# Patient Record
Sex: Male | Born: 1960 | Race: Black or African American | Hispanic: No | Marital: Married | Smoking: Never smoker
Health system: Southern US, Community
[De-identification: ages and names within clinical notes are randomized; demographics above are authoritative.]

## PROBLEM LIST (undated history)

## (undated) DIAGNOSIS — M1990 Unspecified osteoarthritis, unspecified site: Secondary | ICD-10-CM

## (undated) HISTORY — PX: HERNIA REPAIR: SHX51

## (undated) HISTORY — PX: HYDROCELE EXCISION / REPAIR: SUR1145

---

## 2008-12-01 ENCOUNTER — Ambulatory Visit: Payer: Self-pay | Admitting: Family Medicine

## 2015-03-24 ENCOUNTER — Emergency Department (HOSPITAL_BASED_OUTPATIENT_CLINIC_OR_DEPARTMENT_OTHER)
Admission: EM | Admit: 2015-03-24 | Discharge: 2015-03-25 | Disposition: A | Discharge status: Home / Self Care | Payer: BLUE CROSS/BLUE SHIELD | Attending: Emergency Medicine | Admitting: Emergency Medicine

## 2015-03-24 ENCOUNTER — Encounter (HOSPITAL_BASED_OUTPATIENT_CLINIC_OR_DEPARTMENT_OTHER): Payer: Self-pay | Admitting: *Deleted

## 2015-03-24 DIAGNOSIS — K2970 Gastritis, unspecified, without bleeding: Secondary | ICD-10-CM

## 2015-03-24 DIAGNOSIS — R1013 Epigastric pain: Secondary | ICD-10-CM | POA: Diagnosis present

## 2015-03-24 MED ORDER — ONDANSETRON HCL 4 MG/2ML IJ SOLN
4.0000 mg | Freq: Once | INTRAMUSCULAR | Status: AC
  Administered 2015-03-24: 4 mg via INTRAVENOUS
  Filled 2015-03-24: qty 2

## 2015-03-24 MED ORDER — PANTOPRAZOLE SODIUM 40 MG IV SOLR
40.0000 mg | Freq: Once | INTRAVENOUS | Status: AC
  Administered 2015-03-24: 40 mg via INTRAVENOUS
  Filled 2015-03-24: qty 40

## 2015-03-24 MED ORDER — FENTANYL CITRATE (PF) 100 MCG/2ML IJ SOLN
100.0000 ug | Freq: Once | INTRAMUSCULAR | Status: AC
  Administered 2015-03-24: 100 ug via INTRAVENOUS
  Filled 2015-03-24: qty 2

## 2015-03-24 NOTE — ED Provider Notes (Signed)
CSN: [id number]     Arrival date & time 03/24/15  2202 History  This chart was scribed for Paula Libra, MD by Abel Presto, ED Scribe. This patient was seen in room MH02/MH02 and the patient's care was started at 11:11 PM.    Chief Complaint  Patient presents with  . Abdominal Pain    The history is provided by the patient. No language interpreter was used.    HPI HPI Comments: Richard Peters is a 54 y.o. male who presents to the Emergency Department complaining of 9/10 epigastric abdominal pain with gradual onset around 2 hours ago. Pt describes the pain as a "tightening." He states movement and putting pressure on area aggravates the pain. Pt denies similar pain in past. Pt denies SOB, chest pain, fever, chills, nausea, vomiting, and diarrhea.   History reviewed. No pertinent past medical history. History reviewed. No pertinent past surgical history. History reviewed. No pertinent family history. History  Substance Use Topics  . Smoking status: Never Smoker   . Smokeless tobacco: Not on file  . Alcohol Use: No    Review of Systems A complete 10 system review of systems was obtained and all systems are negative except as noted in the HPI and PMH.     Allergies  Review of patient's allergies indicates no known allergies.  Home Medications   Prior to Admission medications   Not on File   BP 167/91 mmHg  Pulse 65  Temp(Src) 98.1 F (36.7 C)  Resp 16  Ht  (1.854 m)  Wt 220 lb (99.791 kg)  BMI 29.03 kg/m2  SpO2 98%   Physical Exam General: Well-developed, well-nourished male in no acute distress; appearance consistent with age of record HENT: normocephalic; atraumatic Eyes: pupils equal, round and reactive to light; extraocular muscles intact Neck: supple Heart: regular rate and rhythm; no murmurs, rubs or gallops Lungs: clear to auscultation bilaterally Abdomen: epigastric and RUQ tenderness most prominent in epigastric region; soft; nondistended; no masses or  hepatosplenomegaly; bowel sounds present Extremities: No deformity; full range of motion; pulses normal Neurologic: Awake, alert and oriented; motor function intact in all extremities and symmetric; no facial droop Skin: Warm and dry Psychiatric: Normal mood and affect Nursing note and vitals reviewed.   ED Course  Procedures (including critical care time) DIAGNOSTIC STUDIES: Oxygen Saturation is 97% on room air, normal by my interpretation.    COORDINATION OF CARE: 11:14 PM Discussed treatment plan with patient at beside, the patient agrees with the plan and has no further questions at this time.     MDM   Nursing notes and vitals signs, including pulse oximetry, reviewed.  Summary of this visit's results, reviewed by myself:  Labs:  Results for orders placed or performed during the hospital encounter of 03/24/15 (from the past 24 hour(s))  Urinalysis, Routine w reflex microscopic (not at [facility])     Status: Abnormal   Collection Time: 03/24/15 11:00 PM  Result Value Ref Range   Color, Urine YELLOW YELLOW   APPearance CLEAR CLEAR   Specific Gravity, Urine 1.014 1.005 - 1.030   pH 6.5 5.0 - 8.0   Glucose, UA NEGATIVE NEGATIVE mg/dL   Hgb urine dipstick MODERATE (A) NEGATIVE   Bilirubin Urine NEGATIVE NEGATIVE   Ketones, ur NEGATIVE NEGATIVE mg/dL   Protein, ur NEGATIVE NEGATIVE mg/dL   Urobilinogen, UA 0.2 0.0 - 1.0 mg/dL   Nitrite NEGATIVE NEGATIVE   Leukocytes, UA NEGATIVE NEGATIVE  Urine microscopic-add on     Status:  None   Collection Time: 03/24/15 11:00 PM  Result Value Ref Range   Squamous Epithelial / LPF RARE RARE   WBC, UA 0-2 <3 WBC/hpf   RBC / HPF 3-6 <3 RBC/hpf   Bacteria, UA RARE RARE  CBC with Differential/Platelet     Status: None   Collection Time: 03/24/15 11:40 PM  Result Value Ref Range   WBC 6.9 4.0 - 10.5 K/uL   RBC 4.88 4.22 - 5.81 MIL/uL   Hemoglobin 13.8 13.0 - 17.0 g/dL   HCT 16.140.7 09.639.0 - 04.552.0 %   MCV 83.4 78.0 - 100.0 fL   MCH 28.3 26.0 -  34.0 pg   MCHC 33.9 30.0 - 36.0 g/dL   RDW 40.913.9 81.111.5 - 91.415.5 %   Platelets 245 150 - 400 K/uL   Neutrophils Relative % 74 43 - 77 %   Neutro Abs 5.1 1.7 - 7.7 K/uL   Lymphocytes Relative 16 12 - 46 %   Lymphs Abs 1.1 0.7 - 4.0 K/uL   Monocytes Relative 8 3 - 12 %   Monocytes Absolute 0.5 0.1 - 1.0 K/uL   Eosinophils Relative 2 0 - 5 %   Eosinophils Absolute 0.1 0.0 - 0.7 K/uL   Basophils Relative 0 0 - 1 %   Basophils Absolute 0.0 0.0 - 0.1 K/uL  Comprehensive metabolic panel     Status: None   Collection Time: 03/24/15 11:40 PM  Result Value Ref Range   Sodium 141 135 - 145 mmol/L   Potassium 3.7 3.5 - 5.1 mmol/L   Chloride 104 101 - 111 mmol/L   CO2 28 22 - 32 mmol/L   Glucose, Bld 90 65 - 99 mg/dL   BUN 12 6 - 20 mg/dL   Creatinine, Ser 7.821.12 0.61 - 1.24 mg/dL   Calcium 9.7 8.9 - 95.610.3 mg/dL   Total Protein 7.6 6.5 - 8.1 g/dL   Albumin 4.5 3.5 - 5.0 g/dL   AST 29 15 - 41 U/L   ALT 29 17 - 63 U/L   Alkaline Phosphatase 48 38 - 126 U/L   Total Bilirubin 0.8 0.3 - 1.2 mg/dL   GFR calc non Af Amer >60 >60 mL/min   GFR calc Af Amer >60 >60 mL/min   Anion gap 9 5 - 15  Lipase, blood     Status: None   Collection Time: 03/24/15 11:40 PM  Result Value Ref Range   Lipase 28 22 - 51 U/L    Imaging Studies: Ct Abdomen Pelvis W Contrast  03/25/2015   CLINICAL DATA:  Epigastric pain for 6 hours. Right upper quadrant pain.  EXAM: CT ABDOMEN AND PELVIS WITH CONTRAST  TECHNIQUE: Multidetector CT imaging of the abdomen and pelvis was performed using the standard protocol following bolus administration of intravenous contrast.  CONTRAST:  50mL OMNIPAQUE IOHEXOL 300 MG/ML SOLN, 100mL OMNIPAQUE IOHEXOL 300 MG/ML SOLN  COMPARISON:  None.  FINDINGS: Mild linear atelectasis in the lung bases. The liver, spleen, gallbladder, pancreas, adrenal glands, kidneys, abdominal aorta, inferior vena cava, and retroperitoneal lymph nodes are unremarkable. Small accessory spleen. The stomach, small bowel, and  colon are not abnormally distended. No free air or free fluid in the abdomen.  Pelvis: Prostate gland is not enlarged. Bladder wall is not thickened. No free or loculated pelvic fluid collections. No pelvic mass or lymphadenopathy. Appendix is normal. Sigmoid colon is unremarkable. Degenerative changes in the lumbar spine and hips. No destructive bone lesions.  IMPRESSION: No acute process demonstrated in the  abdomen or pelvis.   Electronically Signed   By: Burman Nieves M.D.   On: 03/25/2015 02:24     1:20 AM No relief with GI cocktail.   3:03 AM Significant relief with Carafate.  I personally performed the services described in this documentation, which was scribed in my presence. The recorded information has been reviewed and is accurate.    Paula Libra, MD 03/25/15 [phone]

## 2015-03-24 NOTE — ED Notes (Signed)
Pt c/o upper abd pain x 1 hr denies n/v/d

## 2015-03-25 ENCOUNTER — Emergency Department (HOSPITAL_BASED_OUTPATIENT_CLINIC_OR_DEPARTMENT_OTHER): Payer: BLUE CROSS/BLUE SHIELD

## 2015-03-25 LAB — COMPREHENSIVE METABOLIC PANEL
ALK PHOS: 48 U/L (ref 38–126)
ALT: 29 U/L (ref 17–63)
ANION GAP: 9 (ref 5–15)
AST: 29 U/L (ref 15–41)
Albumin: 4.5 g/dL (ref 3.5–5.0)
BUN: 12 mg/dL (ref 6–20)
CHLORIDE: 104 mmol/L (ref 101–111)
CO2: 28 mmol/L (ref 22–32)
CREATININE: 1.12 mg/dL (ref 0.61–1.24)
Calcium: 9.7 mg/dL (ref 8.9–10.3)
GFR calc Af Amer: >60 mL/min (ref >60)
GLUCOSE: 90 mg/dL (ref 65–99)
Potassium: 3.7 mmol/L (ref 3.5–5.1)
Sodium: 141 mmol/L (ref 135–145)
Total Bilirubin: 0.8 mg/dL (ref 0.3–1.2)
Total Protein: 7.6 g/dL (ref 6.5–8.1)

## 2015-03-25 LAB — CBC WITH DIFFERENTIAL/PLATELET
Basophils Absolute: 0 10*3/uL (ref 0.0–0.1)
Basophils Relative: 0 % (ref 0–1)
EOS ABS: 0.1 10*3/uL (ref 0.0–0.7)
EOS PCT: 2 % (ref 0–5)
HCT: 40.7 % (ref 39.0–52.0)
Hemoglobin: 13.8 g/dL (ref 13.0–17.0)
LYMPHS ABS: 1.1 10*3/uL (ref 0.7–4.0)
Lymphocytes Relative: 16 % (ref 12–46)
MCH: 28.3 pg (ref 26.0–34.0)
MCHC: 33.9 g/dL (ref 30.0–36.0)
MCV: 83.4 fL (ref 78.0–100.0)
Monocytes Absolute: 0.5 10*3/uL (ref 0.1–1.0)
Monocytes Relative: 8 % (ref 3–12)
NEUTROS ABS: 5.1 10*3/uL (ref 1.7–7.7)
NEUTROS PCT: 74 % (ref 43–77)
Platelets: 245 10*3/uL (ref 150–400)
RBC: 4.88 MIL/uL (ref 4.22–5.81)
RDW: 13.9 % (ref 11.5–15.5)
WBC: 6.9 10*3/uL (ref 4.0–10.5)

## 2015-03-25 LAB — LIPASE, BLOOD: Lipase: 28 U/L (ref 22–51)

## 2015-03-25 LAB — URINE MICROSCOPIC-ADD ON

## 2015-03-25 LAB — URINALYSIS, ROUTINE W REFLEX MICROSCOPIC
Bilirubin Urine: NEGATIVE
Glucose, UA: NEGATIVE mg/dL
Ketones, ur: NEGATIVE mg/dL
LEUKOCYTES UA: NEGATIVE
NITRITE: NEGATIVE
PROTEIN: NEGATIVE mg/dL
Specific Gravity, Urine: 1.014 (ref 1.005–1.030)
Urobilinogen, UA: 0.2 mg/dL (ref 0.0–1.0)
pH: 6.5 (ref 5.0–8.0)

## 2015-03-25 MED ORDER — IOHEXOL 300 MG/ML  SOLN
50.0000 mL | Freq: Once | INTRAMUSCULAR | Status: AC | PRN
  Administered 2015-03-25: 50 mL via ORAL

## 2015-03-25 MED ORDER — SUCRALFATE 1 G PO TABS
1.0000 g | ORAL_TABLET | Freq: Once | ORAL | Status: AC
  Administered 2015-03-25: 1 g via ORAL

## 2015-03-25 MED ORDER — FENTANYL CITRATE (PF) 100 MCG/2ML IJ SOLN
100.0000 ug | Freq: Once | INTRAMUSCULAR | Status: AC
  Administered 2015-03-25: 100 ug via INTRAVENOUS
  Filled 2015-03-25: qty 2

## 2015-03-25 MED ORDER — IOHEXOL 300 MG/ML  SOLN
100.0000 mL | Freq: Once | INTRAMUSCULAR | Status: AC | PRN
  Administered 2015-03-25: 100 mL via INTRAVENOUS

## 2015-03-25 MED ORDER — GI COCKTAIL ~~LOC~~
30.0000 mL | Freq: Once | ORAL | Status: AC
  Administered 2015-03-25: 30 mL via ORAL
  Filled 2015-03-25: qty 30

## 2015-03-25 MED ORDER — OMEPRAZOLE 40 MG PO CPDR: DELAYED_RELEASE_CAPSULE | ORAL | Status: DC

## 2015-03-25 MED ORDER — SUCRALFATE 1 G PO TABS: 1.0000 g | ORAL_TABLET | Freq: Three times a day (TID) | ORAL | Status: DC

## 2015-03-25 MED ORDER — SUCRALFATE 1 G PO TABS
ORAL_TABLET | ORAL | Status: AC
  Filled 2015-03-25: qty 1

## 2015-03-25 NOTE — ED Notes (Signed)
Pt given oral contrast by Ct staff.

## 2015-03-25 NOTE — Discharge Instructions (Signed)

## 2015-03-25 NOTE — ED Notes (Signed)
Patient transported to CT 

## 2015-03-25 NOTE — ED Notes (Signed)
MD at bedside. 

## 2016-11-05 ENCOUNTER — Emergency Department (HOSPITAL_BASED_OUTPATIENT_CLINIC_OR_DEPARTMENT_OTHER)
Admission: EM | Admit: 2016-11-05 | Discharge: 2016-11-05 | Disposition: A | Discharge status: Home / Self Care | Payer: BLUE CROSS/BLUE SHIELD | Attending: Emergency Medicine | Admitting: Emergency Medicine

## 2016-11-05 ENCOUNTER — Encounter (HOSPITAL_BASED_OUTPATIENT_CLINIC_OR_DEPARTMENT_OTHER): Payer: Self-pay

## 2016-11-05 DIAGNOSIS — R1010 Upper abdominal pain, unspecified: Secondary | ICD-10-CM | POA: Diagnosis present

## 2016-11-05 DIAGNOSIS — R1013 Epigastric pain: Secondary | ICD-10-CM | POA: Insufficient documentation

## 2016-11-05 LAB — CBC WITH DIFFERENTIAL/PLATELET
Basophils Absolute: 0 10*3/uL (ref 0.0–0.1)
Basophils Relative: 1 %
EOS ABS: 0.1 10*3/uL (ref 0.0–0.7)
EOS PCT: 3 %
HCT: 37.2 % — ABNORMAL LOW (ref 39.0–52.0)
Hemoglobin: 12.7 g/dL — ABNORMAL LOW (ref 13.0–17.0)
Lymphocytes Relative: 23 %
Lymphs Abs: 1.3 10*3/uL (ref 0.7–4.0)
MCH: 28.2 pg (ref 26.0–34.0)
MCHC: 34.1 g/dL (ref 30.0–36.0)
MCV: 82.7 fL (ref 78.0–100.0)
Monocytes Absolute: 0.5 10*3/uL (ref 0.1–1.0)
Monocytes Relative: 8 %
NEUTROS PCT: 65 %
Neutro Abs: 3.6 10*3/uL (ref 1.7–7.7)
PLATELETS: 239 10*3/uL (ref 150–400)
RBC: 4.5 MIL/uL (ref 4.22–5.81)
RDW: 14 % (ref 11.5–15.5)
WBC: 5.5 10*3/uL (ref 4.0–10.5)

## 2016-11-05 LAB — COMPREHENSIVE METABOLIC PANEL
ALT: 20 U/L (ref 17–63)
AST: 23 U/L (ref 15–41)
Albumin: 4 g/dL (ref 3.5–5.0)
Alkaline Phosphatase: 43 U/L (ref 38–126)
Anion gap: 7 (ref 5–15)
BILIRUBIN TOTAL: 0.6 mg/dL (ref 0.3–1.2)
BUN: 16 mg/dL (ref 6–20)
CHLORIDE: 106 mmol/L (ref 101–111)
CO2: 26 mmol/L (ref 22–32)
CREATININE: 1.15 mg/dL (ref 0.61–1.24)
Calcium: 9.3 mg/dL (ref 8.9–10.3)
GFR calc non Af Amer: >60 mL/min (ref >60)
Glucose, Bld: 142 mg/dL — ABNORMAL HIGH (ref 65–99)
POTASSIUM: 3.5 mmol/L (ref 3.5–5.1)
Sodium: 139 mmol/L (ref 135–145)
TOTAL PROTEIN: 7.5 g/dL (ref 6.5–8.1)

## 2016-11-05 LAB — LIPASE, BLOOD: LIPASE: 28 U/L (ref 11–51)

## 2016-11-05 MED ORDER — OMEPRAZOLE 40 MG PO CPDR: DELAYED_RELEASE_CAPSULE | ORAL | 0 refills | Status: DC

## 2016-11-05 MED ORDER — SUCRALFATE 1 G PO TABS
1.0000 g | ORAL_TABLET | Freq: Once | ORAL | Status: AC
  Administered 2016-11-05: 1 g via ORAL
  Filled 2016-11-05: qty 1

## 2016-11-05 MED ORDER — ONDANSETRON HCL 4 MG/2ML IJ SOLN
4.0000 mg | Freq: Once | INTRAMUSCULAR | Status: AC
  Administered 2016-11-05: 4 mg via INTRAVENOUS
  Filled 2016-11-05: qty 2

## 2016-11-05 MED ORDER — PANTOPRAZOLE SODIUM 40 MG IV SOLR
INTRAVENOUS | Status: AC
  Filled 2016-11-05: qty 40

## 2016-11-05 MED ORDER — PANTOPRAZOLE SODIUM 40 MG IV SOLR
40.0000 mg | Freq: Once | INTRAVENOUS | Status: AC
  Administered 2016-11-05: 40 mg via INTRAVENOUS

## 2016-11-05 MED ORDER — FENTANYL CITRATE (PF) 100 MCG/2ML IJ SOLN
100.0000 ug | Freq: Once | INTRAMUSCULAR | Status: AC
  Administered 2016-11-05: 100 ug via INTRAVENOUS
  Filled 2016-11-05: qty 2

## 2016-11-05 MED ORDER — SUCRALFATE 1 G PO TABS: 1.0000 g | ORAL_TABLET | Freq: Three times a day (TID) | ORAL | 0 refills | Status: DC

## 2016-11-05 NOTE — ED Triage Notes (Signed)
Pt c/o upper abdominal pain since 11p, states took Malanta with no relief

## 2016-11-05 NOTE — ED Provider Notes (Signed)
[facility] Provider Note: Lowella DellJ. Lane Shenouda Genova, MD, FACEP  CSN: [id number] MRN: [id number] ARRIVAL: 11/05/16 at 0308 ROOM: MH02/MH02   CHIEF COMPLAINT  Abdominal Pain   HISTORY OF PRESENT ILLNESS  Richard Peters is a 56 y.o. male with upper abdominal pain since about 1 AM today. Scribed the pain is dull. He rates it as a 9 out of 10. It is somewhat worse with movement or palpation. He states the pain is similar to that of gastritis 2 years ago. There is no associated nausea, vomiting or diarrhea. He took Mylanta with no relief.   History reviewed. No pertinent past medical history.  History reviewed. No pertinent surgical history.  No family history on file.  Social History  Substance Use Topics  . Smoking status: Never Smoker  . Smokeless tobacco: Never Used  . Alcohol use No    Prior to Admission medications   Medication Sig Start Date End Date Taking? Authorizing Provider  omeprazole (PRILOSEC) 40 MG capsule Take 1 capsule daily at least 30 minutes before first dose of Carafate. 11/05/16   Dejanira Pamintuan, MD  sucralfate (CARAFATE) 1 g tablet Take 1 tablet (1 g total) by mouth 4 (four) times daily -  with meals and at bedtime. 11/05/16   Paula LibraJohn Tenee Wish, MD    Allergies Patient has no known allergies.   REVIEW OF SYSTEMS  Negative except as noted here or in the History of Present Illness.   PHYSICAL EXAMINATION  Initial Vital Signs Blood pressure 156/92, pulse (!) 53, temperature 98.1 F (36.7 C), temperature source Oral, resp. rate 16, height 6\' 1"  (1.854 m), weight 230 lb (104.3 kg), SpO2 97 %.  Examination General: Well-developed, well-nourished male in no acute distress; appearance consistent with age of record HENT: normocephalic; atraumatic Eyes: pupils equal, round and reactive to light; extraocular muscles intact Neck: supple Heart: regular rate and rhythm Lungs: clear to auscultation bilaterally Abdomen: soft; nondistended; tenderness across the entire  upper abdomen; no masses or hepatosplenomegaly; bowel sounds present Extremities: No deformity; full range of motion; pulses normal Neurologic: Awake, alert and oriented; motor function intact in all extremities and symmetric; no facial droop Skin: Warm and dry Psychiatric: Normal mood and affect   RESULTS  Summary of this visit's results, reviewed by myself:   EKG Interpretation  Date/Time:    Ventricular Rate:    PR Interval:    QRS Duration:   QT Interval:    QTC Calculation:   R Axis:     Text Interpretation:        Laboratory Studies: Results for orders placed or performed during the hospital encounter of 11/05/16 (from the past 24 hour(s))  Lipase, blood     Status: None   Collection Time: 11/05/16  3:57 AM  Result Value Ref Range   Lipase 28 11 - 51 U/L  Comprehensive metabolic panel     Status: Abnormal   Collection Time: 11/05/16  3:57 AM  Result Value Ref Range   Sodium 139 135 - 145 mmol/L   Potassium 3.5 3.5 - 5.1 mmol/L   Chloride 106 101 - 111 mmol/L   CO2 26 22 - 32 mmol/L   Glucose, Bld 142 (H) 65 - 99 mg/dL   BUN 16 6 - 20 mg/dL   Creatinine, Ser 5.951.15 0.61 - 1.24 mg/dL   Calcium 9.3 8.9 - 63.810.3 mg/dL   Total Protein 7.5 6.5 - 8.1 g/dL   Albumin 4.0 3.5 - 5.0 g/dL   AST 23 15 -  41 U/L   ALT 20 17 - 63 U/L   Alkaline Phosphatase 43 38 - 126 U/L   Total Bilirubin 0.6 0.3 - 1.2 mg/dL   GFR calc non Af Amer >60 >60 mL/min   GFR calc Af Amer >60 >60 mL/min   Anion gap 7 5 - 15  CBC with Differential/Platelet     Status: Abnormal   Collection Time: 11/05/16  3:57 AM  Result Value Ref Range   WBC 5.5 4.0 - 10.5 K/uL   RBC 4.50 4.22 - 5.81 MIL/uL   Hemoglobin 12.7 (L) 13.0 - 17.0 g/dL   HCT 95.2 (L) 84.1 - 32.4 %   MCV 82.7 78.0 - 100.0 fL   MCH 28.2 26.0 - 34.0 pg   MCHC 34.1 30.0 - 36.0 g/dL   RDW 40.1 02.7 - 25.3 %   Platelets 239 150 - 400 K/uL   Neutrophils Relative % 65 %   Neutro Abs 3.6 1.7 - 7.7 K/uL   Lymphocytes Relative 23 %   Lymphs  Abs 1.3 0.7 - 4.0 K/uL   Monocytes Relative 8 %   Monocytes Absolute 0.5 0.1 - 1.0 K/uL   Eosinophils Relative 3 %   Eosinophils Absolute 0.1 0.0 - 0.7 K/uL   Basophils Relative 1 %   Basophils Absolute 0.0 0.0 - 0.1 K/uL   Imaging Studies: No results found.  ED COURSE  Nursing notes and initial vitals signs, including pulse oximetry, reviewed.  Vitals:   11/05/16 0329 11/05/16 0330  BP: 156/92   Pulse: (!) 53   Resp: 16   Temp: 98.1 F (36.7 C)   TempSrc: Oral   SpO2: 97%   Weight:  230 lb (104.3 kg)  Height:  6\' 1"  (1.854 m)   4:49 AM No relief with Carafate orally and Protonix IV.  5:49 AM Feels better after IV fentanyl. Minimal epigastric tenderness remains. Patient advised of reassuring lab studies. I don't believe an imaging study is indicated at this time. Symptoms are consistent with gastritis. He has not been on regular treatment for gastritis. Will start him on omeprazole and Carafate and advised him to return if symptoms worsen or change.  PROCEDURES    ED DIAGNOSES     ICD-9-CM ICD-10-CM   1. Epigastric abdominal pain 789.06 R10.13        Paula Libra, MD 11/05/16 [phone]

## 2019-09-20 DIAGNOSIS — Z20828 Contact with and (suspected) exposure to other viral communicable diseases: Secondary | ICD-10-CM | POA: Diagnosis not present

## 2019-09-27 ENCOUNTER — Ambulatory Visit: Payer: Self-pay | Attending: Internal Medicine

## 2019-09-27 DIAGNOSIS — Z20822 Contact with and (suspected) exposure to covid-19: Secondary | ICD-10-CM

## 2019-09-28 LAB — NOVEL CORONAVIRUS, NAA: SARS-CoV-2, NAA: NOT DETECTED

## 2019-11-16 DIAGNOSIS — M25461 Effusion, right knee: Secondary | ICD-10-CM | POA: Diagnosis not present

## 2019-11-16 DIAGNOSIS — M25569 Pain in unspecified knee: Secondary | ICD-10-CM | POA: Diagnosis not present

## 2020-03-13 DIAGNOSIS — M2242 Chondromalacia patellae, left knee: Secondary | ICD-10-CM | POA: Diagnosis not present

## 2020-03-13 DIAGNOSIS — M2241 Chondromalacia patellae, right knee: Secondary | ICD-10-CM | POA: Diagnosis not present

## 2020-04-09 DIAGNOSIS — M25562 Pain in left knee: Secondary | ICD-10-CM | POA: Diagnosis not present

## 2020-04-09 DIAGNOSIS — M25561 Pain in right knee: Secondary | ICD-10-CM | POA: Diagnosis not present

## 2020-08-23 DIAGNOSIS — Z20822 Contact with and (suspected) exposure to covid-19: Secondary | ICD-10-CM | POA: Diagnosis not present

## 2020-10-04 DIAGNOSIS — U071 COVID-19: Secondary | ICD-10-CM | POA: Diagnosis not present

## 2020-10-12 DIAGNOSIS — U071 COVID-19: Secondary | ICD-10-CM | POA: Diagnosis not present

## 2020-12-09 ENCOUNTER — Emergency Department (HOSPITAL_BASED_OUTPATIENT_CLINIC_OR_DEPARTMENT_OTHER): Payer: BC Managed Care – PPO

## 2020-12-09 ENCOUNTER — Other Ambulatory Visit: Payer: Self-pay

## 2020-12-09 ENCOUNTER — Emergency Department (HOSPITAL_BASED_OUTPATIENT_CLINIC_OR_DEPARTMENT_OTHER)
Admission: EM | Admit: 2020-12-09 | Discharge: 2020-12-09 | Disposition: A | Discharge status: Home / Self Care | Payer: BC Managed Care – PPO | Attending: Emergency Medicine | Admitting: Emergency Medicine

## 2020-12-09 ENCOUNTER — Encounter (HOSPITAL_BASED_OUTPATIENT_CLINIC_OR_DEPARTMENT_OTHER): Payer: Self-pay

## 2020-12-09 DIAGNOSIS — N432 Other hydrocele: Secondary | ICD-10-CM | POA: Diagnosis not present

## 2020-12-09 DIAGNOSIS — N503 Cyst of epididymis: Secondary | ICD-10-CM | POA: Diagnosis not present

## 2020-12-09 DIAGNOSIS — N5089 Other specified disorders of the male genital organs: Secondary | ICD-10-CM | POA: Diagnosis not present

## 2020-12-09 DIAGNOSIS — N433 Hydrocele, unspecified: Secondary | ICD-10-CM | POA: Diagnosis not present

## 2020-12-09 DIAGNOSIS — N50811 Right testicular pain: Secondary | ICD-10-CM | POA: Diagnosis not present

## 2020-12-09 NOTE — ED Triage Notes (Signed)
Pt presents with complaints of R testicle pain x 1 week.

## 2020-12-09 NOTE — ED Provider Notes (Signed)
[facility] EMERGENCY DEPARTMENT Provider Note   CSN: [id number] Arrival date & time: 12/09/20  1949     History Chief Complaint  Patient presents with  . Testicle Pain    Richard Peters is a 60 y.o. male.  The history is provided by the patient.  Testicle Pain This is a new problem. Episode onset: 1 week. The problem occurs constantly. The problem has been gradually worsening. Pertinent negatives include no chest pain, no abdominal pain, no headaches and no shortness of breath. Nothing aggravates the symptoms. Nothing relieves the symptoms. He has tried nothing for the symptoms. The treatment provided no relief.       History reviewed. No pertinent past medical history.  There are no problems to display for this patient.   History reviewed. No pertinent surgical history.     No family history on file.  Social History   Tobacco Use  . Smoking status: Never Smoker  . Smokeless tobacco: Never Used  Substance Use Topics  . Alcohol use: No  . Drug use: No    Home Medications Prior to Admission medications   Medication Sig Start Date End Date Taking? Authorizing Provider  omeprazole (PRILOSEC) 40 MG capsule Take 1 capsule daily at least 30 minutes before first dose of Carafate. 11/05/16   Molpus, John, MD  sucralfate (CARAFATE) 1 g tablet Take 1 tablet (1 g total) by mouth 4 (four) times daily -  with meals and at bedtime. 11/05/16   Molpus, Jonny Ruiz, MD    Allergies    Patient has no known allergies.  Review of Systems   Review of Systems  Constitutional: Negative for chills and fever.  HENT: Negative for ear pain and sore throat.   Eyes: Negative for pain and visual disturbance.  Respiratory: Negative for cough and shortness of breath.   Cardiovascular: Negative for chest pain and palpitations.  Gastrointestinal: Negative for abdominal pain and vomiting.  Genitourinary: Positive for scrotal swelling and testicular pain. Negative for difficulty urinating,  dysuria, genital sores, hematuria, penile discharge and penile pain.  Musculoskeletal: Negative for arthralgias and back pain.  Skin: Negative for color change and rash.  Neurological: Negative for seizures, syncope and headaches.  All other systems reviewed and are negative.   Physical Exam Updated Vital Signs BP (!) 193/95 (BP Location: Right Arm)   Pulse 75   Temp 98.3 F (36.8 C)   Resp 18   Ht 6' (1.829 m)   Wt 102.1 kg   SpO2 98%   BMI 30.52 kg/m   Physical Exam Vitals and nursing note reviewed. Exam conducted with a chaperone present.  Constitutional:      Appearance: Normal appearance.  HENT:     Head: Normocephalic and atraumatic.  Eyes:     Conjunctiva/sclera: Conjunctivae normal.  Pulmonary:     Effort: Pulmonary effort is normal. No respiratory distress.  Genitourinary:    Penis: Normal.      Comments: Large, diffusely tender, swollen right hemiscrotum without reducible hernia. Musculoskeletal:        General: No deformity. Normal range of motion.     Cervical back: Normal range of motion.  Skin:    General: Skin is warm and dry.  Neurological:     General: No focal deficit present.     Mental Status: He is alert and oriented to person, place, and time. Mental status is at baseline.  Psychiatric:        Mood and Affect: Mood normal.  ED Results / Procedures / Treatments   Labs (all labs ordered are listed, but only abnormal results are displayed) Labs Reviewed - No data to display  EKG None  Radiology US SCROTUM W/DOPPLER  Result Date: 12/09/2020 CLINICAL DATA:  Right-sided testicular swelling for 1 week EXAM: SCROTAL ULTRASOUND DOPPLER ULTRASOUND OF THE TESTICLES TECHNIQUE: Complete ultrasound examination of the testicles, epididymis, and other scrotal structures was performed. Color and spectral Doppler ultrasound were also utilized to evaluate blood flow to the testicles. COMPARISON:  None. FINDINGS: Right testicle Measurements: 4.7 x 3.3 x  3.5 cm. Scattered microcalcifications are noted. No mass lesion is seen. Left testicle Measurements: 3.2 x 3.2 x 3.2 cm. Scattered microcalcifications are noted. No mass lesion is noted. Right epididymis:  3 mm cyst is noted in the head of the epididymis. Left epididymis:  Normal in size and appearance. Hydrocele: Bilateral hydroceles are noted right considerably larger than left. Varicocele:  None visualized. Pulsed Doppler interrogation of both testes demonstrates normal low resistance arterial and venous waveforms bilaterally. IMPRESSION: Bilateral hydroceles right considerably greater than left. This would account for the testicular swelling. Microcalcifications in the testicles bilaterally although no discrete mass is seen. Small right epididymal cyst. Electronically Signed   By: Alcide Clever M.D.   On: 12/09/2020 21:13    Procedures Procedures   Medications Ordered in ED Medications - No data to display  ED Course  I have reviewed the triage vital signs and the nursing notes.  Pertinent labs & imaging results that were available during my care of the patient were reviewed by me and considered in my medical decision making (see chart for details).    MDM Rules/Calculators/A&P                          Ofilia Neas presents with atraumatic right testicle swelling.  No other symptoms.  Specifically, no symptoms of a sexually transmitted infection, UTI, or genital trauma.  Exam not consistent with a hernia.  Ultrasound reveals a hydrocele.  The patient will be referred to urology for outpatient management. Final Clinical Impression(s) / ED Diagnoses Final diagnoses:  Other hydrocele    Rx / DC Orders ED Discharge Orders    None       Koleen Distance, MD 12/09/20 2136

## 2020-12-09 NOTE — ED Notes (Signed)
Patient transported to Ultrasound 

## 2021-01-11 DIAGNOSIS — N430 Encysted hydrocele: Secondary | ICD-10-CM | POA: Diagnosis not present

## 2021-02-10 DIAGNOSIS — N430 Encysted hydrocele: Secondary | ICD-10-CM | POA: Diagnosis not present

## 2021-02-22 DIAGNOSIS — R8271 Bacteriuria: Secondary | ICD-10-CM | POA: Diagnosis not present

## 2021-02-22 DIAGNOSIS — N430 Encysted hydrocele: Secondary | ICD-10-CM | POA: Diagnosis not present

## 2021-03-02 DIAGNOSIS — N430 Encysted hydrocele: Secondary | ICD-10-CM | POA: Diagnosis not present

## 2021-04-28 ENCOUNTER — Ambulatory Visit (INDEPENDENT_AMBULATORY_CARE_PROVIDER_SITE_OTHER): Payer: BC Managed Care – PPO | Admitting: Family Medicine

## 2021-04-28 ENCOUNTER — Other Ambulatory Visit: Payer: Self-pay

## 2021-04-28 ENCOUNTER — Encounter: Payer: Self-pay | Admitting: Family Medicine

## 2021-04-28 VITALS — BP 128/82 | HR 59 | Temp 98.2°F | Ht 71.5 in | Wt 218.6 lb

## 2021-04-28 DIAGNOSIS — Z23 Encounter for immunization: Secondary | ICD-10-CM

## 2021-04-28 DIAGNOSIS — Z0000 Encounter for general adult medical examination without abnormal findings: Secondary | ICD-10-CM

## 2021-04-28 DIAGNOSIS — R351 Nocturia: Secondary | ICD-10-CM

## 2021-04-28 DIAGNOSIS — Z1159 Encounter for screening for other viral diseases: Secondary | ICD-10-CM

## 2021-04-28 DIAGNOSIS — Z9889 Other specified postprocedural states: Secondary | ICD-10-CM

## 2021-04-28 DIAGNOSIS — Z125 Encounter for screening for malignant neoplasm of prostate: Secondary | ICD-10-CM

## 2021-04-28 DIAGNOSIS — M25561 Pain in right knee: Secondary | ICD-10-CM

## 2021-04-28 NOTE — Progress Notes (Signed)
   Subjective:    Patient ID: Richard Peters, male    DOB: 31-Mar-1961, 60 y.o.   MRN: [id number]  HPI He is here for complete examination.  He has been getting his care episodically only on an as-needed basis.  He is here at his wife's request.  He has had a recent hydrocelectomy and is still having difficulty and does have an appointment set up with urology in several weeks.  He does complain of some nocturia but no hesitancy or decreased stream.  He did have a colonoscopy roughly 6 years ago.  Does complain of some knee pain and apparently was seen and told he had decreased cartilage.  Presently he is not under any care and not taking any medication for this.  Family and social history as well as health maintenance and immunizations was reviewed.   Review of Systems  All other systems reviewed and are negative.     Objective:   Physical Exam Alert and in no distress. Tympanic membranes and canals are normal. Pharyngeal area is normal. Neck is supple without adenopathy or thyromegaly. Cardiac exam shows a regular sinus rhythm without murmurs or gallops. Lungs are clear to auscultation.Abdominal exam shows no masses or tenderness.  Genitalia shows no evidence of hydrocele.  Uncircumcised penis.  Testes seem to normal.  Rectal exam shows a normal-sized prostate.        Assessment & Plan:  Routine general medical examination at a health care facility - Plan: CBC with Differential/Platelet, Comprehensive metabolic panel, Lipid panel  Nocturia  Need for hepatitis C screening test - Plan: Hepatitis C antibody  Need for Tdap vaccination - Plan: Tdap vaccine greater than or equal to 7yo IM  History of hydrocelectomy  Screening for prostate cancer - Plan: PSA  Right knee pain, unspecified chronicity  I discussed the nocturia with him and recommend that he cut back on fluids at night and make sure he empties his bladder.  Explained that I did not think it was due to prostate  enlargement Recommend he follow-up with urology concerning continued pain.With urology concerning the continued pain in the genital area. I then discussed the knee pain with himRecommending Tylenol for initial pain relief and then using NSAID and if continued difficulty coming here for further care. I recommended that he get Shingrix at age 46

## 2021-04-29 LAB — COMPREHENSIVE METABOLIC PANEL
ALT: 20 IU/L (ref 0–44)
AST: 22 IU/L (ref 0–40)
Albumin/Globulin Ratio: 1.8 (ref 1.2–2.2)
Albumin: 4.5 g/dL (ref 3.8–4.9)
Alkaline Phosphatase: 57 IU/L (ref 44–121)
BUN/Creatinine Ratio: 13 (ref 9–20)
BUN: 14 mg/dL (ref 6–24)
Bilirubin Total: 0.8 mg/dL (ref 0.0–1.2)
CO2: 24 mmol/L (ref 20–29)
Calcium: 9.4 mg/dL (ref 8.7–10.2)
Chloride: 104 mmol/L (ref 96–106)
Creatinine, Ser: 1.04 mg/dL (ref 0.76–1.27)
Globulin, Total: 2.5 g/dL (ref 1.5–4.5)
Glucose: 73 mg/dL (ref 65–99)
Potassium: 4.2 mmol/L (ref 3.5–5.2)
Sodium: 143 mmol/L (ref 134–144)
Total Protein: 7 g/dL (ref 6.0–8.5)
eGFR: 83 mL/min/{1.73_m2} (ref >59)

## 2021-04-29 LAB — CBC WITH DIFFERENTIAL/PLATELET
Basophils Absolute: 0 10*3/uL (ref 0.0–0.2)
Basos: 1 %
EOS (ABSOLUTE): 0.1 10*3/uL (ref 0.0–0.4)
Eos: 2 %
Hematocrit: 37.8 % (ref 37.5–51.0)
Hemoglobin: 12.5 g/dL — ABNORMAL LOW (ref 13.0–17.7)
Immature Grans (Abs): 0 10*3/uL (ref 0.0–0.1)
Immature Granulocytes: 0 %
Lymphocytes Absolute: 1.6 10*3/uL (ref 0.7–3.1)
Lymphs: 36 %
MCH: 27.4 pg (ref 26.6–33.0)
MCHC: 33.1 g/dL (ref 31.5–35.7)
MCV: 83 fL (ref 79–97)
Monocytes Absolute: 0.5 10*3/uL (ref 0.1–0.9)
Monocytes: 10 %
Neutrophils Absolute: 2.3 10*3/uL (ref 1.4–7.0)
Neutrophils: 51 %
Platelets: 222 10*3/uL (ref 150–450)
RBC: 4.56 x10E6/uL (ref 4.14–5.80)
RDW: 13.8 % (ref 11.6–15.4)
WBC: 4.5 10*3/uL (ref 3.4–10.8)

## 2021-04-29 LAB — LIPID PANEL
Chol/HDL Ratio: 3.2 ratio (ref 0.0–5.0)
Cholesterol, Total: 126 mg/dL (ref 100–199)
HDL: 40 mg/dL (ref >39)
LDL Chol Calc (NIH): 73 mg/dL (ref 0–99)
Triglycerides: 63 mg/dL (ref 0–149)
VLDL Cholesterol Cal: 13 mg/dL (ref 5–40)

## 2021-04-29 LAB — PSA: Prostate Specific Ag, Serum: 0.6 ng/mL (ref 0.0–4.0)

## 2021-04-29 LAB — HEPATITIS C ANTIBODY: Hep C Virus Ab: <0.1 s/co ratio (ref 0.0–0.9)

## 2021-05-24 DIAGNOSIS — N430 Encysted hydrocele: Secondary | ICD-10-CM | POA: Diagnosis not present

## 2021-05-24 DIAGNOSIS — N5082 Scrotal pain: Secondary | ICD-10-CM | POA: Diagnosis not present

## 2021-06-01 DIAGNOSIS — N5082 Scrotal pain: Secondary | ICD-10-CM | POA: Diagnosis not present

## 2021-06-21 ENCOUNTER — Other Ambulatory Visit: Payer: Self-pay | Admitting: Surgery

## 2021-06-21 DIAGNOSIS — K4020 Bilateral inguinal hernia, without obstruction or gangrene, not specified as recurrent: Secondary | ICD-10-CM | POA: Diagnosis not present

## 2021-07-26 DIAGNOSIS — K4020 Bilateral inguinal hernia, without obstruction or gangrene, not specified as recurrent: Secondary | ICD-10-CM | POA: Diagnosis not present

## 2021-09-15 ENCOUNTER — Other Ambulatory Visit: Payer: Self-pay | Admitting: Surgery

## 2021-09-15 DIAGNOSIS — Z09 Encounter for follow-up examination after completed treatment for conditions other than malignant neoplasm: Secondary | ICD-10-CM

## 2021-09-15 DIAGNOSIS — R1031 Right lower quadrant pain: Secondary | ICD-10-CM

## 2021-09-16 ENCOUNTER — Ambulatory Visit
Admission: RE | Admit: 2021-09-16 | Discharge: 2021-09-16 | Disposition: A | Discharge status: Home / Self Care | Payer: BC Managed Care – PPO | Source: Ambulatory Visit | Attending: Surgery | Admitting: Surgery

## 2021-09-16 DIAGNOSIS — Z09 Encounter for follow-up examination after completed treatment for conditions other than malignant neoplasm: Secondary | ICD-10-CM

## 2021-09-16 DIAGNOSIS — N5089 Other specified disorders of the male genital organs: Secondary | ICD-10-CM | POA: Diagnosis not present

## 2021-09-16 DIAGNOSIS — N4341 Spermatocele of epididymis, single: Secondary | ICD-10-CM | POA: Diagnosis not present

## 2021-09-16 DIAGNOSIS — N433 Hydrocele, unspecified: Secondary | ICD-10-CM | POA: Diagnosis not present

## 2021-09-16 DIAGNOSIS — R1031 Right lower quadrant pain: Secondary | ICD-10-CM

## 2021-11-12 DIAGNOSIS — N5082 Scrotal pain: Secondary | ICD-10-CM | POA: Diagnosis not present

## 2022-01-21 ENCOUNTER — Inpatient Hospital Stay (HOSPITAL_BASED_OUTPATIENT_CLINIC_OR_DEPARTMENT_OTHER)
Admission: EM | Admit: 2022-01-21 | Discharge: 2022-01-25 | DRG: 419 | Disposition: A | Discharge status: Home / Self Care | Payer: BC Managed Care – PPO | Attending: Internal Medicine | Admitting: Internal Medicine

## 2022-01-21 ENCOUNTER — Emergency Department (HOSPITAL_BASED_OUTPATIENT_CLINIC_OR_DEPARTMENT_OTHER): Payer: BC Managed Care – PPO

## 2022-01-21 ENCOUNTER — Other Ambulatory Visit: Payer: Self-pay

## 2022-01-21 ENCOUNTER — Encounter (HOSPITAL_BASED_OUTPATIENT_CLINIC_OR_DEPARTMENT_OTHER): Payer: Self-pay | Admitting: Emergency Medicine

## 2022-01-21 DIAGNOSIS — Z8249 Family history of ischemic heart disease and other diseases of the circulatory system: Secondary | ICD-10-CM

## 2022-01-21 DIAGNOSIS — K219 Gastro-esophageal reflux disease without esophagitis: Secondary | ICD-10-CM | POA: Diagnosis not present

## 2022-01-21 DIAGNOSIS — K838 Other specified diseases of biliary tract: Secondary | ICD-10-CM | POA: Diagnosis present

## 2022-01-21 DIAGNOSIS — K8010 Calculus of gallbladder with chronic cholecystitis without obstruction: Secondary | ICD-10-CM | POA: Diagnosis not present

## 2022-01-21 DIAGNOSIS — K8000 Calculus of gallbladder with acute cholecystitis without obstruction: Secondary | ICD-10-CM | POA: Diagnosis not present

## 2022-01-21 DIAGNOSIS — K828 Other specified diseases of gallbladder: Secondary | ICD-10-CM | POA: Diagnosis not present

## 2022-01-21 DIAGNOSIS — Z20822 Contact with and (suspected) exposure to covid-19: Secondary | ICD-10-CM | POA: Diagnosis not present

## 2022-01-21 DIAGNOSIS — K8020 Calculus of gallbladder without cholecystitis without obstruction: Secondary | ICD-10-CM | POA: Diagnosis present

## 2022-01-21 DIAGNOSIS — R1013 Epigastric pain: Secondary | ICD-10-CM

## 2022-01-21 DIAGNOSIS — Z833 Family history of diabetes mellitus: Secondary | ICD-10-CM

## 2022-01-21 DIAGNOSIS — Z79899 Other long term (current) drug therapy: Secondary | ICD-10-CM

## 2022-01-21 DIAGNOSIS — N281 Cyst of kidney, acquired: Secondary | ICD-10-CM | POA: Diagnosis not present

## 2022-01-21 DIAGNOSIS — K824 Cholesterolosis of gallbladder: Secondary | ICD-10-CM | POA: Diagnosis present

## 2022-01-21 DIAGNOSIS — K8080 Other cholelithiasis without obstruction: Secondary | ICD-10-CM | POA: Diagnosis not present

## 2022-01-21 DIAGNOSIS — R748 Abnormal levels of other serum enzymes: Secondary | ICD-10-CM | POA: Diagnosis not present

## 2022-01-21 DIAGNOSIS — K8012 Calculus of gallbladder with acute and chronic cholecystitis without obstruction: Secondary | ICD-10-CM | POA: Diagnosis not present

## 2022-01-21 DIAGNOSIS — R1084 Generalized abdominal pain: Secondary | ICD-10-CM | POA: Diagnosis not present

## 2022-01-21 DIAGNOSIS — R932 Abnormal findings on diagnostic imaging of liver and biliary tract: Secondary | ICD-10-CM | POA: Diagnosis not present

## 2022-01-21 DIAGNOSIS — J9811 Atelectasis: Secondary | ICD-10-CM | POA: Diagnosis not present

## 2022-01-21 DIAGNOSIS — K429 Umbilical hernia without obstruction or gangrene: Secondary | ICD-10-CM | POA: Diagnosis not present

## 2022-01-21 DIAGNOSIS — R945 Abnormal results of liver function studies: Secondary | ICD-10-CM | POA: Diagnosis not present

## 2022-01-21 DIAGNOSIS — R109 Unspecified abdominal pain: Secondary | ICD-10-CM | POA: Diagnosis not present

## 2022-01-21 DIAGNOSIS — J90 Pleural effusion, not elsewhere classified: Secondary | ICD-10-CM | POA: Diagnosis not present

## 2022-01-21 DIAGNOSIS — R933 Abnormal findings on diagnostic imaging of other parts of digestive tract: Secondary | ICD-10-CM | POA: Diagnosis not present

## 2022-01-21 DIAGNOSIS — R935 Abnormal findings on diagnostic imaging of other abdominal regions, including retroperitoneum: Secondary | ICD-10-CM | POA: Diagnosis not present

## 2022-01-21 LAB — URINALYSIS, ROUTINE W REFLEX MICROSCOPIC
Glucose, UA: NEGATIVE mg/dL
Ketones, ur: NEGATIVE mg/dL
Leukocytes,Ua: NEGATIVE
Nitrite: NEGATIVE
Protein, ur: NEGATIVE mg/dL
Specific Gravity, Urine: 1.015 (ref 1.005–1.030)
pH: 5.5 (ref 5.0–8.0)

## 2022-01-21 LAB — COMPREHENSIVE METABOLIC PANEL
ALT: 600 U/L — ABNORMAL HIGH (ref 0–44)
AST: 325 U/L — ABNORMAL HIGH (ref 15–41)
Albumin: 3.9 g/dL (ref 3.5–5.0)
Alkaline Phosphatase: 180 U/L — ABNORMAL HIGH (ref 38–126)
Anion gap: 5 (ref 5–15)
BUN: 9 mg/dL (ref 6–20)
CO2: 27 mmol/L (ref 22–32)
Calcium: 9 mg/dL (ref 8.9–10.3)
Chloride: 106 mmol/L (ref 98–111)
Creatinine, Ser: 1.02 mg/dL (ref 0.61–1.24)
GFR, Estimated: >60 mL/min (ref >60)
Glucose, Bld: 102 mg/dL — ABNORMAL HIGH (ref 70–99)
Potassium: 3.8 mmol/L (ref 3.5–5.1)
Sodium: 138 mmol/L (ref 135–145)
Total Bilirubin: 5.3 mg/dL — ABNORMAL HIGH (ref 0.3–1.2)
Total Protein: 7 g/dL (ref 6.5–8.1)

## 2022-01-21 LAB — URINALYSIS, MICROSCOPIC (REFLEX)

## 2022-01-21 LAB — CBC
HCT: 40 % (ref 39.0–52.0)
Hemoglobin: 13.3 g/dL (ref 13.0–17.0)
MCH: 27.4 pg (ref 26.0–34.0)
MCHC: 33.3 g/dL (ref 30.0–36.0)
MCV: 82.5 fL (ref 80.0–100.0)
Platelets: 252 10*3/uL (ref 150–400)
RBC: 4.85 MIL/uL (ref 4.22–5.81)
RDW: 15.1 % (ref 11.5–15.5)
WBC: 3.5 10*3/uL — ABNORMAL LOW (ref 4.0–10.5)
nRBC: 0 % (ref 0.0–0.2)

## 2022-01-21 LAB — LIPASE, BLOOD: Lipase: 63 U/L — ABNORMAL HIGH (ref 11–51)

## 2022-01-21 MED ORDER — HYDROMORPHONE HCL 1 MG/ML IJ SOLN: 0.5000 mg | INTRAMUSCULAR | Status: DC | PRN

## 2022-01-21 MED ORDER — IOHEXOL 300 MG/ML  SOLN
100.0000 mL | Freq: Once | INTRAMUSCULAR | Status: AC | PRN
  Administered 2022-01-21: 100 mL via INTRAVENOUS

## 2022-01-21 MED ORDER — MELATONIN 3 MG PO TABS: 3.0000 mg | ORAL_TABLET | Freq: Every evening | ORAL | Status: DC | PRN

## 2022-01-21 MED ORDER — PIPERACILLIN-TAZOBACTAM 3.375 G IVPB 30 MIN
3.3750 g | Freq: Once | INTRAVENOUS | Status: AC
  Administered 2022-01-21: 3.375 g via INTRAVENOUS
  Filled 2022-01-21: qty 50

## 2022-01-21 MED ORDER — OXYCODONE HCL 5 MG PO TABS
5.0000 mg | ORAL_TABLET | Freq: Four times a day (QID) | ORAL | Status: DC | PRN
  Administered 2022-01-25: 5 mg via ORAL
  Filled 2022-01-21: qty 1

## 2022-01-21 MED ORDER — IBUPROFEN 400 MG PO TABS: 400.0000 mg | ORAL_TABLET | Freq: Four times a day (QID) | ORAL | Status: DC | PRN

## 2022-01-21 MED ORDER — POLYETHYLENE GLYCOL 3350 17 G PO PACK
17.0000 g | PACK | Freq: Every day | ORAL | Status: DC | PRN
Start: 2022-01-21 — End: 2022-01-25

## 2022-01-21 MED ORDER — SODIUM CHLORIDE 0.9 % IV SOLN: INTRAVENOUS | Status: DC | PRN

## 2022-01-21 MED ORDER — PIPERACILLIN-TAZOBACTAM 3.375 G IVPB
3.3750 g | Freq: Three times a day (TID) | INTRAVENOUS | Status: DC
  Administered 2022-01-21 – 2022-01-24 (×9): 3.375 g via INTRAVENOUS
  Filled 2022-01-21 (×9): qty 50

## 2022-01-21 MED ORDER — ONDANSETRON HCL 4 MG/2ML IJ SOLN
4.0000 mg | Freq: Four times a day (QID) | INTRAMUSCULAR | Status: DC | PRN
  Administered 2022-01-24: 4 mg via INTRAVENOUS
  Filled 2022-01-21: qty 2

## 2022-01-21 MED ORDER — SODIUM CHLORIDE 0.9 % IV SOLN: Freq: Once | INTRAVENOUS | Status: AC

## 2022-01-21 NOTE — ED Triage Notes (Signed)
Pt arrives pov, steady gait c/o epigastric pain that started overnight, similar symptoms 2 weeks pta. Referred by UC ?

## 2022-01-21 NOTE — ED Provider Notes (Signed)
?[facility] EMERGENCY DEPARTMENT ?Provider Note ? ? ?CSN: [id number] ?Arrival date & time: 01/21/22  1030 ? ?  ? ?History ?Chief Complaint  ?Patient presents with  ? Abdominal Pain  ? ? ?Richard Peters is a 61 y.o. male. ? ?Patient presents with epigastric that started 3 days ago, this seems to be recurrent as he experienced similar symptoms 2 weeks ago and then on and off for the last few years. Wife present at bedside. He says he came to the ED more than a year ago for similar symptoms, imaging was normal and it was thought to be gastritis so they gave him omeprazole which helped at the time. He had not been taking his omeprazole for awhile and then restarted this 3 days ago but this did not seem to help. He eats a lot of fried foods and trigger foods. Denies any recent trauma or injury. He is unable to describe the pain as either dull, aching or sharp but feels like more of a tightness sensation. Denies chest pain, dyspnea, nausea, vomiting, dysuria, hematemesis, hematochezia, constipation, leg swelling, headaches and vision changes. Has had loose stools over the past week, typically has a BM daily that is soft and formed.  ? ?The history is provided by the patient and the spouse. No language interpreter was used.  ?Abdominal Pain ?Pain location:  Epigastric ?Pain quality: pressure and throbbing   ?Pain radiates to:  Does not radiate ?Pain severity:  Moderate ?Onset quality:  Gradual ?Duration:  3 days ?Timing:  Intermittent ?Progression:  Waxing and waning ?Chronicity:  Recurrent ?Context: not alcohol use, not awakening from sleep, not diet changes, not sick contacts and not trauma   ?Relieved by:  Bowel activity ?Worsened by:  Nothing ?Associated symptoms: no chest pain, no chills, no constipation, no cough, no dysuria, no fever, no hematemesis, no hematochezia, no hematuria, no melena, no nausea, no shortness of breath and no vomiting   ?Risk factors: no alcohol abuse and no NSAID use   ? ?  ? ?Home  Medications ?Prior to Admission medications   ?Medication Sig Start Date End Date Taking? Authorizing Provider  ?acetaminophen (TYLENOL) 500 MG tablet Take 500 mg by mouth every 6 (six) hours as needed.    [provider]  ?omeprazole (PRILOSEC) 40 MG capsule Take 1 capsule daily at least 30 minutes before first dose of Carafate. ?Patient not taking: Reported on 04/28/2021 11/05/16   Molpus, Jonny Ruiz, MD  ?sucralfate (CARAFATE) 1 g tablet Take 1 tablet (1 g total) by mouth 4 (four) times daily -  with meals and at bedtime. ?Patient not taking: Reported on 04/28/2021 11/05/16   Molpus, Jonny Ruiz, MD  ?   ? ?Allergies    ?Patient has no known allergies.   ? ?Review of Systems   ?Review of Systems  ?Constitutional:  Negative for chills and fever.  ?HENT:  Negative for congestion.   ?Respiratory:  Negative for cough and shortness of breath.   ?Cardiovascular:  Negative for chest pain.  ?Gastrointestinal:  Positive for abdominal pain. Negative for constipation, hematemesis, hematochezia, melena, nausea and vomiting.  ?Genitourinary:  Negative for dysuria and hematuria.  ?Musculoskeletal:  Negative for back pain.  ?Skin:  Negative for rash.  ?Neurological:  Negative for dizziness, weakness, light-headedness and headaches.  ?Psychiatric/Behavioral:  Negative for confusion.   ? ?Physical Exam ?Updated Vital Signs ?BP (!) 135/92 (BP Location: Right Arm)   Pulse 60   Temp 98.6 ?F (37 ?C) (Oral)   Resp 16  Ht 6' (1.829 m)   Wt 99.8 kg   SpO2 99%   BMI 29.84 kg/m?  ?Physical Exam ?Vitals reviewed.  ?Constitutional:   ?   General: He is not in acute distress. ?   Appearance: Normal appearance. He is not toxic-appearing.  ?HENT:  ?   Head: Normocephalic and atraumatic.  ?   Right Ear: External ear normal.  ?   Left Ear: External ear normal.  ?   Nose: Nose normal. No congestion or rhinorrhea.  ?   Mouth/Throat:  ?   Mouth: Mucous membranes are moist.  ?   Pharynx: Oropharynx is clear. No oropharyngeal exudate or posterior  oropharyngeal erythema.  ?Eyes:  ?   General: No scleral icterus.    ?   Right eye: No discharge.     ?   Left eye: No discharge.  ?   Extraocular Movements: Extraocular movements intact.  ?   Conjunctiva/sclera: Conjunctivae normal.  ?Cardiovascular:  ?   Rate and Rhythm: Normal rate and regular rhythm.  ?   Pulses: Normal pulses.  ?   Heart sounds: Normal heart sounds. No murmur heard. ?  No gallop.  ?Pulmonary:  ?   Effort: Pulmonary effort is normal. No respiratory distress.  ?   Breath sounds: Normal breath sounds. No stridor. No wheezing or rales.  ?Abdominal:  ?   General: Bowel sounds are normal. There is no distension.  ?   Palpations: Abdomen is soft.  ?   Tenderness: There is abdominal tenderness in the epigastric area. There is no right CVA tenderness, left CVA tenderness, guarding or rebound. Negative signs include Murphy's sign, Rovsing's sign and McBurney's sign.  ?   Hernia: No hernia is present.  ?Musculoskeletal:  ?   Cervical back: Normal range of motion and neck supple. No tenderness.  ?   Right lower leg: No edema.  ?   Left lower leg: No edema.  ?Lymphadenopathy:  ?   Cervical: No cervical adenopathy.  ?Skin: ?   General: Skin is warm and dry.  ?   Capillary Refill: Capillary refill takes less than 2 seconds.  ?   Coloration: Skin is not pale.  ?   Findings: No erythema, lesion or rash.  ?Neurological:  ?   General: No focal deficit present.  ?   Mental Status: He is alert and oriented to person, place, and time.  ?   Cranial Nerves: No cranial nerve deficit.  ?   Sensory: No sensory deficit.  ?   Motor: No weakness.  ?Psychiatric:     ?   Mood and Affect: Mood normal.     ?   Behavior: Behavior normal.  ? ? ?ED Results / Procedures / Treatments   ?Labs ?(all labs ordered are listed, but only abnormal results are displayed) ?Labs Reviewed  ?LIPASE, BLOOD - Abnormal; Notable for the following components:  ?    Result Value  ? Lipase 63 (*)   ? All other components within normal limits   ?COMPREHENSIVE METABOLIC PANEL - Abnormal; Notable for the following components:  ? Glucose, Bld 102 (*)   ? AST 325 (*)   ? ALT 600 (*)   ? Alkaline Phosphatase 180 (*)   ? Total Bilirubin 5.3 (*)   ? All other components within normal limits  ?CBC - Abnormal; Notable for the following components:  ? WBC 3.5 (*)   ? All other components within normal limits  ?URINALYSIS, ROUTINE W REFLEX MICROSCOPIC - Abnormal;  Notable for the following components:  ? Color, Urine AMBER (*)   ? Hgb urine dipstick SMALL (*)   ? Bilirubin Urine LARGE (*)   ? All other components within normal limits  ?URINALYSIS, MICROSCOPIC (REFLEX) - Abnormal; Notable for the following components:  ? Bacteria, UA MANY (*)   ? All other components within normal limits  ? ? ?EKG ?EKG Interpretation ? ?Date/Time:  Friday Jan 21 2022 11:06:08 EDT ?Ventricular Rate:  61 ?PR Interval:  170 ?QRS Duration: 104 ?QT Interval:  398 ?QTC Calculation: 400 ?R Axis:   -59 ?Text Interpretation: Sinus rhythm with Premature supraventricular complexes Left anterior fascicular block Possible Anterior infarct , age undetermined Abnormal ECG No previous ECGs available Confirmed by Gareth Morgan [phone]) on 01/21/2022 12:38:35 PM ? ?Radiology ?CT ABDOMEN PELVIS W CONTRAST ? ?Result Date: 01/21/2022 ?CLINICAL DATA:  Epigastric pain which began overnight, similar symptoms 2 weeks ago EXAM: CT ABDOMEN AND PELVIS WITH CONTRAST TECHNIQUE: Multidetector CT imaging of the abdomen and pelvis was performed using the standard protocol following bolus administration of intravenous contrast. RADIATION DOSE REDUCTION: This exam was performed according to the departmental dose-optimization program which includes automated exposure control, adjustment of the mA and/or kV according to patient size and/or use of iterative reconstruction technique. CONTRAST:  135mL OMNIPAQUE IOHEXOL 300 MG/ML SOLN IV. No oral contrast. COMPARISON:  CT abdomen and pelvis 03/25/2015, CT pelvis 06/01/2021  FINDINGS: Lower chest: Basilar atelectasis of LEFT lower lobe and lingula. Hepatobiliary: Mild intrahepatic and extrahepatic biliary dilatation, CBD 10 mm diameter. Mild gallbladder wall thickening. No focal hepatic mass.

## 2022-01-21 NOTE — ED Notes (Signed)
Phone Handoff Report given to [facility] Transport Team, spoke with David 

## 2022-01-21 NOTE — H&P (Signed)
?History and Physical ? ?Richard Peters [id number] DOB: 11-02-1960 DOA: 01/21/2022 ? ?Referring physician: Dr. Billy Fischer, EDP-accepted by Dr. Verlon Au, [facility], hospitalist service. ?PCP: Denita Lung, MD  ?Outpatient Specialists: None. ?Patient coming from: Home through [facility] ED ? ?Chief Complaint: Abdominal pain ? ?HPI: Richard Peters is a 61 y.o. male with medical history significant for GERD who presented initially to the emergent care due to worsening upper abdominal pain associated with intermittent nausea and vomiting x1, 4 days ago, 1 episode of loose stool on the day of presentation.  Symptoms started less than a week ago at that time he had vomited x1 then his symptoms subsided.  They recurred on Tuesday 4 days ago, and gradually got worse.  The night prior to presentation, his abdominal pain was quite severe causing him to have minimal oral intake.  The next morning he presented to emergent care, blood work revealed abnormal LFTs, therefore was sent to [facility] ED for further evaluation.  In the ED, CT scan abdomen and pelvis with contrast reveals dilated common bile duct with concern for choledocholithiasis.  EDP discussed case with GI who recommended MRCP and will see in consultation.  General surgery was also consulted by EDP.  [facility], hospitalist team, was asked to admit. ? ?ED Course: Tmax 98.7.  BP 156/92, pulse 64, respiration rate 15, oxygen saturation 100% on room air.  Lab studies remarkable for alkaline phosphatase 180, lipase 63, AST 325, ALT 600, T. bili [name] 5.3.  WBC 3.5, hemoglobin 13.3, platelet 252.  CT abdomen pelvis with contrast showing mild gallbladder wall thickening, common bile duct measuring 10 mm in diameter. ? ?Review of Systems: ?Review of systems as noted in the HPI. All other systems reviewed and are negative. ? ?Past medical history: ?GERD ? ?Past surgical history: ?None ? ? ?Social History:  reports that he has never smoked. He has never used smokeless tobacco. He reports that he  does not drink alcohol and does not use drugs. ? ? ?No Known Allergies ? ?Family history: ?Mother with history of diabetes ?Father with history of hypertension. ? ? ? ?Prior to Admission medications   ?Medication Sig Start Date End Date Taking? Authorizing Provider  ?acetaminophen (TYLENOL) 500 MG tablet Take 500 mg by mouth every 6 (six) hours as needed for moderate pain.   Yes [provider]  ?Multiple Vitamin (MULTIVITAMIN WITH MINERALS) TABS tablet Take 1 tablet by mouth daily.   Yes [provider]  ?omeprazole (PRILOSEC OTC) 20 MG tablet Take 20 mg by mouth daily as needed (heartburn).   Yes [provider]  ?omeprazole (PRILOSEC) 40 MG capsule Take 1 capsule daily at least 30 minutes before first dose of Carafate. ?Patient not taking: Reported on 04/28/2021 11/05/16   Molpus, Jenny Reichmann, MD  ?sucralfate (CARAFATE) 1 g tablet Take 1 tablet (1 g total) by mouth 4 (four) times daily -  with meals and at bedtime. ?Patient not taking: Reported on 04/28/2021 11/05/16   Molpus, Jenny Reichmann, MD  ? ? ?Physical Exam: ?BP (!) 156/92   Pulse 64   Temp 98.6 ?F (37 ?C) (Oral)   Resp 18   Ht 6' (1.829 m)   Wt 99.8 kg   SpO2 100%   BMI 29.84 kg/m?  ? ?General: 61 y.o. year-old male well developed well nourished in no acute distress.  Alert and oriented x3. ?Cardiovascular: Regular rate and rhythm with no rubs or gallops.  No thyromegaly or JVD noted.  No lower extremity edema. 2/4 pulses in all 4  extremities. ?Respiratory: Clear to auscultation with no wheezes or rales. Good inspiratory effort. ?Abdomen: Soft, tenderness with mild palpation at epigastric region.  Nondistended with normal bowel sounds x4 quadrants. ?Muskuloskeletal: No cyanosis, clubbing or edema noted bilaterally ?Neuro: CN II-XII intact, strength, sensation, reflexes ?Skin: No ulcerative lesions noted or rashes ?Psychiatry: Judgement and insight appear normal. Mood is appropriate for condition and setting ?   ?   ?   ?Labs on Admission:   ?Basic Metabolic Panel: ?Recent Labs  ?Lab 01/21/22 ?1109  ?NA 138  ?K 3.8  ?CL 106  ?CO2 27  ?GLUCOSE 102*  ?BUN 9  ?CREATININE 1.02  ?CALCIUM 9.0  ? ?Liver Function Tests: ?Recent Labs  ?Lab 01/21/22 ?1109  ?AST 325*  ?ALT 600*  ?ALKPHOS 180*  ?BILITOT 5.3*  ?PROT 7.0  ?ALBUMIN 3.9  ? ?Recent Labs  ?Lab 01/21/22 ?1109  ?LIPASE 63*  ? ?No results for input(s): AMMONIA in the last 168 hours. ?CBC: ?Recent Labs  ?Lab 01/21/22 ?1109  ?WBC 3.5*  ?HGB 13.3  ?HCT 40.0  ?MCV 82.5  ?PLT 252  ? ?Cardiac Enzymes: ?No results for input(s): CKTOTAL, CKMB, CKMBINDEX, TROPONINI in the last 168 hours. ? ?BNP (last 3 results) ?No results for input(s): BNP in the last 8760 hours. ? ?ProBNP (last 3 results) ?No results for input(s): PROBNP in the last 8760 hours. ? ?CBG: ?No results for input(s): GLUCAP in the last 168 hours. ? ?Radiological Exams on Admission: ?CT ABDOMEN PELVIS W CONTRAST ? ?Result Date: 01/21/2022 ?CLINICAL DATA:  Epigastric pain which began overnight, similar symptoms 2 weeks ago EXAM: CT ABDOMEN AND PELVIS WITH CONTRAST TECHNIQUE: Multidetector CT imaging of the abdomen and pelvis was performed using the standard protocol following bolus administration of intravenous contrast. RADIATION DOSE REDUCTION: This exam was performed according to the departmental dose-optimization program which includes automated exposure control, adjustment of the mA and/or kV according to patient size and/or use of iterative reconstruction technique. CONTRAST:  133mL OMNIPAQUE IOHEXOL 300 MG/ML SOLN IV. No oral contrast. COMPARISON:  CT abdomen and pelvis 03/25/2015, CT pelvis 06/01/2021 FINDINGS: Lower chest: Basilar atelectasis of LEFT lower lobe and lingula. Hepatobiliary: Mild intrahepatic and extrahepatic biliary dilatation, CBD 10 mm diameter. Mild gallbladder wall thickening. No focal hepatic mass. Pancreas: Normal appearance Spleen: Normal appearance. Small splenule inferior to splenic hilum. Adrenals/Urinary Tract: Minimally  nodular appearing LEFT adrenal gland stable without discrete mass. RIGHT adrenal gland normal. Small LEFT renal cysts. Questionable bladder wall thickening. No hydronephrosis, hydroureter, or urinary tract calcification. Stomach/Bowel: Normal appendix. Large and small bowel loops unremarkable. Stomach normal appearance. Vascular/Lymphatic: Aorta normal caliber without aneurysm. No adenopathy. Reproductive: Unremarkable prostate gland and seminal vesicles Other: Infiltrative changes in the prevesical space and deep to mildly dilated inguinal canals bilaterally larger on RIGHT. These changes are new since the previous exam. No free air or free fluid. Tiny umbilical hernia containing fat. Musculoskeletal: Degenerative changes with subchondral cystic change at the hip joints bilaterally. Facet degenerative changes lumbar spine L4-L5 and L5-S1. No acute osseous findings. IMPRESSION: Infiltrative changes in the prevesical space and deep to mildly dilated inguinal canals bilaterally larger on RIGHT with associated mild bladder wall thickening, new since the previous exam; these could represent inflammation/infection or the sequela of trauma/hemorrhage, recommend correlation with patient history and urinalysis. Postsurgical changes could also cause a similar finding though I do not have history of prior herniorrhaphy or recent surgery. Mild intrahepatic and extrahepatic biliary dilatation, CBD 10 mm diameter, with mild gallbladder wall thickening, recommend correlation  with LFTs and consider follow-up RIGHT upper quadrant sonography. Tiny umbilical hernia containing fat. Electronically Signed   By: Lavonia Dana M.D.   On: 01/21/2022 14:18   ? ?EKG: I independently viewed the EKG done and my findings are as followed: Sinus rhythm rate of 61.  Nonspecific ST-T changes.  QTc 400. ? ?Assessment/Plan ?Present on Admission: ? Abdominal pain ? ?Principal Problem: ?  Abdominal pain ? ?Abdominal pain with concern for biliary  obstruction, possible choledocholithiasis ?Presented with upper abdominal pain mostly epigastric region ?Elevated liver chemistries, T bilirubin 5.3, lipase minimally elevated 63, repeat in the morning due to the lo

## 2022-01-21 NOTE — ED Notes (Signed)
NPO status, states he last ate at 1830hrs yesterday, May 11th. Last drank water this am at approx 0800hrs. Client instructed to remain NPO until further orders are received.  ?

## 2022-01-21 NOTE — ED Notes (Signed)
Up to restroom, gait very steady, ambulated independently, appears in no distress. Awaiting for tx to CT ?

## 2022-01-21 NOTE — ED Notes (Signed)
Resting quietly on stretcher, cont to await for tx to [facility] for admission, comfort measures provided ?

## 2022-01-21 NOTE — ED Notes (Signed)
Phone Handoff Report given to [facility] at [facility] ?

## 2022-01-21 NOTE — Progress Notes (Signed)
Pharmacy Antibiotic Note ? ?Richard Peters is a 61 y.o. male admitted on 01/21/2022 with  intra-abdominal infection .  Pharmacy has been consulted for zosyn dosing. Pt is afebrile and WBC is slightly low. Scr is WNL.  ? ?Plan: ?Zosyn 3.375gm IV Q8H (4 hr inf) ?F/u renal fxn, C&S, clinical status  ?*Pharmacy will sign off and only follow peripherally as no further dose adjustments are anticipated.  ? ?Height: 6' (182.9 cm) ?Weight: 99.8 kg (220 lb) ?IBW/kg (Calculated) : 77.6 ? ?Temp (24hrs), Avg:98.6 ?F (37 ?C), Min:98.6 ?F (37 ?C), Max:98.6 ?F (37 ?C) ? ?Recent Labs  ?Lab 01/21/22 ?1109  ?WBC 3.5*  ?CREATININE 1.02  ?  ?Estimated Creatinine Clearance: 94.2 mL/min (by C-G formula based on SCr of 1.02 mg/dL).   ? ?No Known Allergies ? ?Thank you for allowing pharmacy to be a part of this patient?s care. ? ?Cynthea Zachman, Rande Lawman ?01/21/2022 2:46 PM ? ?

## 2022-01-21 NOTE — ED Provider Notes (Signed)
?  Physical Exam  ?BP 132/68   Pulse 63   Temp 98.7 ?F (37.1 ?C) (Oral)   Resp 18   Ht 6' (1.829 m)   Wt 99.8 kg   SpO2 100%   BMI 29.84 kg/m?  ? ?Physical Exam ? ?Procedures  ?Procedures ? ?ED Course / MDM  ?  ?Medical Decision Making ?Amount and/or Complexity of Data Reviewed ?Labs: ordered. ?Radiology: ordered. ? ?Risk ?Prescription drug management. ?Decision regarding hospitalization. ? ? ?Consulted Dr. Therisa Doyne GI, recommends MRCP. Contacted Dr. Marlou Starks and notified through [place]. ? ?Plan on admission for further work up of biliary obstruction, concern for possible choledocholithiasis with MRCP. Given empiric zosyn.  ? ? ? ? ?  ?Gareth Morgan, MD ?01/21/22 1531 ? ?

## 2022-01-22 ENCOUNTER — Inpatient Hospital Stay (HOSPITAL_COMMUNITY): Payer: BC Managed Care – PPO

## 2022-01-22 DIAGNOSIS — R1013 Epigastric pain: Secondary | ICD-10-CM | POA: Diagnosis not present

## 2022-01-22 LAB — CBC WITH DIFFERENTIAL/PLATELET
Abs Immature Granulocytes: 0.02 10*3/uL (ref 0.00–0.07)
Basophils Absolute: 0 10*3/uL (ref 0.0–0.1)
Basophils Relative: 1 %
Eosinophils Absolute: 0.2 10*3/uL (ref 0.0–0.5)
Eosinophils Relative: 4 %
HCT: 38.6 % — ABNORMAL LOW (ref 39.0–52.0)
Hemoglobin: 12.8 g/dL — ABNORMAL LOW (ref 13.0–17.0)
Immature Granulocytes: 1 %
Lymphocytes Relative: 26 %
Lymphs Abs: 1.1 10*3/uL (ref 0.7–4.0)
MCH: 28.1 pg (ref 26.0–34.0)
MCHC: 33.2 g/dL (ref 30.0–36.0)
MCV: 84.6 fL (ref 80.0–100.0)
Monocytes Absolute: 0.3 10*3/uL (ref 0.1–1.0)
Monocytes Relative: 7 %
Neutro Abs: 2.7 10*3/uL (ref 1.7–7.7)
Neutrophils Relative %: 61 %
Platelets: 201 10*3/uL (ref 150–400)
RBC: 4.56 MIL/uL (ref 4.22–5.81)
RDW: 15.5 % (ref 11.5–15.5)
WBC: 4.3 10*3/uL (ref 4.0–10.5)
nRBC: 0 % (ref 0.0–0.2)

## 2022-01-22 LAB — MAGNESIUM: Magnesium: 2 mg/dL (ref 1.7–2.4)

## 2022-01-22 LAB — COMPREHENSIVE METABOLIC PANEL
ALT: 439 U/L — ABNORMAL HIGH (ref 0–44)
AST: 147 U/L — ABNORMAL HIGH (ref 15–41)
Albumin: 3.8 g/dL (ref 3.5–5.0)
Alkaline Phosphatase: 156 U/L — ABNORMAL HIGH (ref 38–126)
Anion gap: 5 (ref 5–15)
BUN: 8 mg/dL (ref 6–20)
CO2: 26 mmol/L (ref 22–32)
Calcium: 8.7 mg/dL — ABNORMAL LOW (ref 8.9–10.3)
Chloride: 108 mmol/L (ref 98–111)
Creatinine, Ser: 1.12 mg/dL (ref 0.61–1.24)
GFR, Estimated: >60 mL/min (ref >60)
Glucose, Bld: 78 mg/dL (ref 70–99)
Potassium: 3.7 mmol/L (ref 3.5–5.1)
Sodium: 139 mmol/L (ref 135–145)
Total Bilirubin: 1.5 mg/dL — ABNORMAL HIGH (ref 0.3–1.2)
Total Protein: 6.7 g/dL (ref 6.5–8.1)

## 2022-01-22 LAB — RESP PANEL BY RT-PCR (FLU A&B, COVID) ARPGX2
Influenza A by PCR: NEGATIVE
Influenza B by PCR: NEGATIVE
SARS Coronavirus 2 by RT PCR: NEGATIVE

## 2022-01-22 LAB — PHOSPHORUS: Phosphorus: 3.2 mg/dL (ref 2.5–4.6)

## 2022-01-22 MED ORDER — OMEPRAZOLE MAGNESIUM 20 MG PO TBEC: 20.0000 mg | DELAYED_RELEASE_TABLET | Freq: Every day | ORAL | Status: DC | PRN

## 2022-01-22 MED ORDER — SODIUM CHLORIDE 0.9 % IV SOLN: INTRAVENOUS | Status: AC

## 2022-01-22 MED ORDER — ADULT MULTIVITAMIN W/MINERALS CH
1.0000 | ORAL_TABLET | Freq: Every day | ORAL | Status: DC
  Administered 2022-01-23 – 2022-01-25 (×2): 1 via ORAL
  Filled 2022-01-22 (×2): qty 1

## 2022-01-22 MED ORDER — GADOBUTROL 1 MMOL/ML IV SOLN
9.0000 mL | Freq: Once | INTRAVENOUS | Status: AC | PRN
  Administered 2022-01-22: 9 mL via INTRAVENOUS

## 2022-01-22 MED ORDER — PANTOPRAZOLE SODIUM 40 MG PO TBEC: 40.0000 mg | DELAYED_RELEASE_TABLET | Freq: Every day | ORAL | Status: DC | PRN

## 2022-01-22 NOTE — Progress Notes (Signed)
?PROGRESS NOTE ? ? ? ?Richard Peters  ZDG:[id number] DOB: [birth year] DOA: 01/21/2022 ?PCP: Richard Nian, MD  ? ? ? ?Brief Narrative:  ?61 y.o. BM PMHx GERD  ? ?Presented initially to the emergent care due to worsening upper abdominal pain associated with intermittent nausea and vomiting x1, 4 days ago, 1 episode of loose stool on the day of presentation.  Symptoms started less than a week ago at that time he had vomited x1 then his symptoms subsided.  They recurred on Tuesday 4 days ago, and gradually got worse.  The night prior to presentation, his abdominal pain was quite severe causing him to have minimal oral intake.  The next morning he presented to emergent care, blood work revealed abnormal LFTs, therefore was sent to [facility] ED for further evaluation.  In the ED, CT scan abdomen and pelvis with contrast reveals dilated common bile duct with concern for choledocholithiasis.  EDP discussed case with GI who recommended MRCP and will see in consultation.  General surgery was also consulted by EDP.  TRH, hospitalist team, was asked to admit. ?  ?ED Course: Tmax 98.7.  BP 156/92, pulse 64, respiration rate 15, oxygen saturation 100% on room air.  Lab studies remarkable for alkaline phosphatase 180, lipase 63, AST 325, ALT 600, T. bili Richard Peters 5.3.  WBC 3.5, hemoglobin 13.3, platelet 252.  CT abdomen pelvis with contrast showing mild gallbladder wall thickening, common bile duct measuring 10 mm in diameter ? ? ?Subjective: ?A/O x4, currently negative abdominal pain, but previously pain in LUQ, epigastric region, RUQ ? ? ?Assessment & Plan: ?Covid vaccination; ?  ?Principal Problem: ?  Abdominal pain ? ? ? Abdominal pain with concern for biliary obstruction, possible choledocholithiasis ?Presented with upper abdominal pain mostly epigastric region ?Elevated liver chemistries, T bilirubin 5.3, lipase minimally elevated 63, repeat in the morning due to the location of his pain.  Normal appearance of the pancreas on CT  scan. ?Common bile duct dilatation 10 mm in diameter on CT scan ?MRCP ordered, follow results ?GI and general surgery consulted by EDP ?Pain management and bowel regimen in place ?N.p.o. after midnight until seen by GI ?Patient was started on Zosyn in the ED, continue ?Monitor fever curve and WBC. ?- 5/13 per Dr. Levora Angel GI following recommendations: ?Await MRI MRCP. ?-If MRI MRCP showed CBD stone, he will need ERCP in the next few days. ?-Regardless of MRI MRCP findings, he will benefit from cholecystectomy. ?-Okay to have full liquid diet after MRI. ?  ?Elevated BP ?No prior history of hypertension ?Suspected related to his pain ?  ?GERD ?Resume home PPI ? ? ?  ?Mobility Assessment (last 72 hours)   ? ? Mobility Assessment   ? ? Row Name 01/22/22 0800 01/21/22 1952 01/21/22 1948  ?  ?  ? Does patient have an order for bedrest or is patient medically unstable No - Continue assessment No - Continue assessment No - Continue assessment    ? What is the highest level of mobility based on the progressive mobility assessment? Level 6 (Walks independently in room and hall) - Balance while walking in room without assist - Complete Level 6 (Walks independently in room and hall) - Balance while walking in room without assist - Complete Level 6 (Walks independently in room and hall) - Balance while walking in room without assist - Complete    ? ?  ?  ? ?  ? ? ? ?@IPAL @ ? ?   ?DVT prophylaxis: SCD ?Code Status:  Full ?Family Communication: 5/13 wife at bedside for discussion of plan of care all questions answered ?Status is: Inpatient ? ? ? ?Dispo: The patient is from: Home ?             Anticipated d/c is to: Home ?             Anticipated d/c date is: 3 days ?             Patient currently is not medically stable to d/c. ? ? ? ? ? ?Consultants:  ?Dr. Levora AngelBrahmbhatt GI ? ?Procedures/Significant Events:  ? ? ?I have personally reviewed and interpreted all radiology studies and my findings are as above. ? ?VENTILATOR  SETTINGS: ? ? ? ?Cultures ?5/13 influenza A/B pending ?5/13 SARS coronavirus pending ? ? ? ?Antimicrobials: ? ? ? ?Devices ?  ? ?LINES / TUBES:  ? ? ? ? ?Continuous Infusions: ? sodium chloride 10 mL/hr at 01/21/22 1443  ? sodium chloride 50 mL/hr at 01/22/22 0528  ? piperacillin-tazobactam (ZOSYN)  IV 3.375 g (01/22/22 0519)  ? ? ? ?Objective: ?Vitals:  ? 01/21/22 1800 01/21/22 1939 01/22/22 0159 01/22/22 0517  ?BP: 116/71 (!) 159/92 (!) 141/99 (!) 139/93  ?Pulse: 71 64 63 63  ?Resp: 18 18 18 18   ?Temp:  98.6 ?F (37 ?C) 98.4 ?F (36.9 ?C) 98 ?F (36.7 ?C)  ?TempSrc:  Oral Oral Oral  ?SpO2: 95% 100% 98% 98%  ?Weight:      ?Height:      ? ? ?Intake/Output Summary (Last 24 hours) at 01/22/2022 0817 ?Last data filed at 01/22/2022 0600 ?Gross per 24 hour  ?Intake 339.48 ml  ?Output --  ?Net 339.48 ml  ? ?Filed Weights  ? 01/21/22 1057  ?Weight: 99.8 kg  ? ? ?Examination: ? ?General: A/O x4, No acute respiratory distress ?Eyes: negative scleral hemorrhage, negative anisocoria, negative icterus ?ENT: Negative Runny nose, negative gingival bleeding, ?Neck:  Negative scars, masses, torticollis, lymphadenopathy, JVD ?Lungs: Clear to auscultation bilaterally without wheezes or crackles ?Cardiovascular: Regular rate and rhythm without murmur gallop or rub normal S1 and S2 ?Abdomen: negative abdominal pain, nondistended, positive soft, bowel sounds, no rebound, no ascites, no appreciable mass ?Extremities: No significant cyanosis, clubbing, or edema bilateral lower extremities ?Skin: Negative rashes, lesions, ulcers ?Psychiatric:  Negative depression, negative anxiety, negative fatigue, negative mania  ?Central nervous system:  Cranial nerves II through XII intact, tongue/uvula midline, all extremities muscle strength 5/5, sensation intact throughout, negative dysarthria, negative expressive aphasia, negative receptive aphasia. ? ?.  ? ? ? ?Data Reviewed: Care during the described time interval was provided by me .  I have reviewed  this patient's available data, including medical history, events of note, physical examination, and all test results as part of my evaluation. ? ?CBC: ?Recent Labs  ?Lab 01/21/22 ?1109 01/22/22 ?[id number]  ?WBC 3.5* 4.3  ?NEUTROABS  --  2.7  ?HGB 13.3 12.8*  ?HCT 40.0 38.6*  ?MCV 82.5 84.6  ?PLT 252 201  ? ?Basic Metabolic Panel: ?Recent Labs  ?Lab 01/21/22 ?1109 01/22/22 ?[id number]  ?NA 138 139  ?K 3.8 3.7  ?CL 106 108  ?CO2 27 26  ?GLUCOSE 102* 78  ?BUN 9 8  ?CREATININE 1.02 1.12  ?CALCIUM 9.0 8.7*  ?MG  --  2.0  ?PHOS  --  3.2  ? ?GFR: ?Estimated Creatinine Clearance: 85.8 mL/min (by C-G formula based on SCr of 1.12 mg/dL). ?Liver Function Tests: ?Recent Labs  ?Lab 01/21/22 ?1109 01/22/22 ?[id number]  ?AST 325* 147*  ?  ALT 600* 439*  ?ALKPHOS 180* 156*  ?BILITOT 5.3* 1.5*  ?PROT 7.0 6.7  ?ALBUMIN 3.9 3.8  ? ?Recent Labs  ?Lab 01/21/22 ?1109  ?LIPASE 63*  ? ?No results for input(s): AMMONIA in the last 168 hours. ?Coagulation Profile: ?No results for input(s): INR, PROTIME in the last 168 hours. ?Cardiac Enzymes: ?No results for input(s): CKTOTAL, CKMB, CKMBINDEX, TROPONINI in the last 168 hours. ?BNP (last 3 results) ?No results for input(s): PROBNP in the last 8760 hours. ?HbA1C: ?No results for input(s): HGBA1C in the last 72 hours. ?CBG: ?No results for input(s): GLUCAP in the last 168 hours. ?Lipid Profile: ?No results for input(s): CHOL, HDL, LDLCALC, TRIG, CHOLHDL, LDLDIRECT in the last 72 hours. ?Thyroid Function Tests: ?No results for input(s): TSH, T4TOTAL, FREET4, T3FREE, THYROIDAB in the last 72 hours. ?Anemia Panel: ?No results for input(s): VITAMINB12, FOLATE, FERRITIN, TIBC, IRON, RETICCTPCT in the last 72 hours. ?Sepsis Labs: ?No results for input(s): PROCALCITON, LATICACIDVEN in the last 168 hours. ? ?No results found for this or any previous visit (from the past 240 hour(s)).  ? ? ? ? ? ?Radiology Studies: ?CT ABDOMEN PELVIS W CONTRAST ? ?Result Date: 01/21/2022 ?CLINICAL DATA:  Epigastric pain which began  overnight, similar symptoms 2 weeks ago EXAM: CT ABDOMEN AND PELVIS WITH CONTRAST TECHNIQUE: Multidetector CT imaging of the abdomen and pelvis was performed using the standard protocol following bolus administration of in

## 2022-01-22 NOTE — Consult Note (Signed)
Reason for Consult:abd pain ?Referring Physician: Dr. Joseph Art ? ?Richard Peters is an 61 y.o. male.  ?HPI: The pt is a 62 year old black male who presents with abd pain that started a couple days ago. Pain is described as severe. He denies any fever. He did have some nausea but no vomiting. He came to ER where lft's were found to be elevated. Cbd dilated but no stones reported. Admitted to medical service ? ?History reviewed. No pertinent past medical history. ? ?History reviewed. No pertinent surgical history. ? ?History reviewed. No pertinent family history. ? ?Social History:  reports that he has never smoked. He has never used smokeless tobacco. He reports that he does not drink alcohol and does not use drugs. ? ?Allergies: No Known Allergies ? ?Medications: I have reviewed the patient's current medications. ? ?Results for orders placed or performed during the hospital encounter of 01/21/22 (from the past 48 hour(s))  ?Lipase, blood     Status: Abnormal  ? Collection Time: 01/21/22 11:09 AM  ?Result Value Ref Range  ? Lipase 63 (H) 11 - 51 U/L  ?  Comment: Performed at [facility], [address] [place], [place] [address]  ?Comprehensive metabolic panel     Status: Abnormal  ? Collection Time: 01/21/22 11:09 AM  ?Result Value Ref Range  ? Sodium 138 135 - 145 mmol/L  ? Potassium 3.8 3.5 - 5.1 mmol/L  ? Chloride 106 98 - 111 mmol/L  ? CO2 27 22 - 32 mmol/L  ? Glucose, Bld 102 (H) 70 - 99 mg/dL  ?  Comment: Glucose reference range applies only to samples taken after fasting for at least 8 hours.  ? BUN 9 6 - 20 mg/dL  ? Creatinine, Ser 1.02 0.61 - 1.24 mg/dL  ? Calcium 9.0 8.9 - 10.3 mg/dL  ? Total Protein 7.0 6.5 - 8.1 g/dL  ? Albumin 3.9 3.5 - 5.0 g/dL  ? AST 325 (H) 15 - 41 U/L  ? ALT 600 (H) 0 - 44 U/L  ? Alkaline Phosphatase 180 (H) 38 - 126 U/L  ? Total Bilirubin 5.3 (H) 0.3 - 1.2 mg/dL  ? GFR, Estimated >60 >60 mL/min  ?  Comment: (NOTE) ?Calculated using the CKD-EPI Creatinine Equation (2021) ?   ? Anion gap 5 5 - 15  ?  Comment: Performed at [facility], [address], [place], [place] [address]  ?CBC     Status: Abnormal  ? Collection Time: 01/21/22 11:09 AM  ?Result Value Ref Range  ? WBC 3.5 (L) 4.0 - 10.5 K/uL  ? RBC 4.85 4.22 - 5.81 MIL/uL  ? Hemoglobin 13.3 13.0 - 17.0 g/dL  ? HCT 40.0 39.0 - 52.0 %  ? MCV 82.5 80.0 - 100.0 fL  ? MCH 27.4 26.0 - 34.0 pg  ? MCHC 33.3 30.0 - 36.0 g/dL  ? RDW 15.1 11.5 - 15.5 %  ? Platelets 252 150 - 400 K/uL  ? nRBC 0.0 0.0 - 0.2 %  ?  Comment: Performed at [facility], [address] [place], [place] [address]  ?Urinalysis, Routine w reflex microscopic Urine, Clean Catch     Status: Abnormal  ? Collection Time: 01/21/22 12:34 PM  ?Result Value Ref Range  ? Color, Urine AMBER (A) YELLOW  ?  Comment: BIOCHEMICALS MAY BE AFFECTED BY COLOR  ? APPearance CLEAR CLEAR  ? Specific Gravity, Urine 1.015 1.005 - 1.030  ? pH 5.5 5.0 -  8.0  ? Glucose, UA NEGATIVE NEGATIVE mg/dL  ? Hgb urine dipstick SMALL (A) NEGATIVE  ? Bilirubin Urine LARGE (A) NEGATIVE  ? Ketones, ur NEGATIVE NEGATIVE mg/dL  ? Protein, ur NEGATIVE NEGATIVE mg/dL  ? Nitrite NEGATIVE NEGATIVE  ? Leukocytes,Ua NEGATIVE NEGATIVE  ?  Comment: Performed at [facility], [address] [place], [place] [address]  ?Urinalysis, Microscopic (reflex)     Status: Abnormal  ? Collection Time: 01/21/22 12:34 PM  ?Result Value Ref Range  ? RBC / HPF 0-5 0 - 5 RBC/hpf  ? WBC, UA 0-5 0 - 5 WBC/hpf  ? Bacteria, UA MANY (A) NONE SEEN  ? Squamous Epithelial / LPF 0-5 0 - 5  ?  Comment: Performed at [facility], [address] [place], [place] [address]  ?CBC with Differential/Platelet     Status: Abnormal  ? Collection Time: 01/22/22  5:21 AM  ?Result Value Ref Range  ? WBC 4.3 4.0 - 10.5 K/uL  ? RBC 4.56 4.22 - 5.81 MIL/uL  ? Hemoglobin 12.8 (L) 13.0 - 17.0 g/dL  ? HCT 38.6 (L) 39.0 - 52.0 %  ? MCV 84.6 80.0 - 100.0 fL  ? MCH 28.1 26.0 - 34.0 pg  ? MCHC 33.2 30.0 - 36.0 g/dL  ?  RDW 15.5 11.5 - 15.5 %  ? Platelets 201 150 - 400 K/uL  ? nRBC 0.0 0.0 - 0.2 %  ? Neutrophils Relative % 61 %  ? Neutro Abs 2.7 1.7 - 7.7 K/uL  ? Lymphocytes Relative 26 %  ? Lymphs Abs 1.1 0.7 - 4.0 K/uL  ? Monocytes Relative 7 %  ? Monocytes Absolute 0.3 0.1 - 1.0 K/uL  ? Eosinophils Relative 4 %  ? Eosinophils Absolute 0.2 0.0 - 0.5 K/uL  ? Basophils Relative 1 %  ? Basophils Absolute 0.0 0.0 - 0.1 K/uL  ? Immature Granulocytes 1 %  ? Abs Immature Granulocytes 0.02 0.00 - 0.07 K/uL  ?  Comment: Performed at [facility], [address] [place], [place] [address]  ?Comprehensive metabolic panel     Status: Abnormal  ? Collection Time: 01/22/22  5:21 AM  ?Result Value Ref Range  ? Sodium 139 135 - 145 mmol/L  ? Potassium 3.7 3.5 - 5.1 mmol/L  ? Chloride 108 98 - 111 mmol/L  ? CO2 26 22 - 32 mmol/L  ? Glucose, Bld 78 70 - 99 mg/dL  ?  Comment: Glucose reference range applies only to samples taken after fasting for at least 8 hours.  ? BUN 8 6 - 20 mg/dL  ? Creatinine, Ser 1.12 0.61 - 1.24 mg/dL  ? Calcium 8.7 (L) 8.9 - 10.3 mg/dL  ? Total Protein 6.7 6.5 - 8.1 g/dL  ? Albumin 3.8 3.5 - 5.0 g/dL  ? AST 147 (H) 15 - 41 U/L  ? ALT 439 (H) 0 - 44 U/L  ? Alkaline Phosphatase 156 (H) 38 - 126 U/L  ? Total Bilirubin 1.5 (H) 0.3 - 1.2 mg/dL  ? GFR, Estimated >60 >60 mL/min  ?  Comment: (NOTE) ?Calculated using the CKD-EPI Creatinine Equation (2021) ?  ? Anion gap 5 5 - 15  ?  Comment: Performed at [facility], [address] [place], [place] [address]  ?Magnesium     Status: None  ? Collection Time: 01/22/22  5:21 AM  ?Result Value Ref Range  ? Magnesium 2.0 1.7 - 2.4 mg/dL  ?  Comment: Performed  at [facility], [address], [place], [place] [address]  ?Phosphorus     Status: None  ? Collection Time: 01/22/22  5:21 AM  ?Result Value Ref Range  ? Phosphorus 3.2 2.5 - 4.6 mg/dL  ?  Comment: Performed at [facility], [address],  [place], [place] [address]  ? ? ?CT ABDOMEN PELVIS W CONTRAST ? ?Result Date: 01/21/2022 ?CLINICAL DATA:  Epigastric pain which began overnight, similar symptoms 2 weeks ago EXAM: CT ABDOMEN AND PELVIS WITH CONTRAST TECHNIQUE: Multidetector CT imaging of the abdomen and pelvis was performed using the standard protocol following bolus administration of intravenous contrast. RADIATION DOSE REDUCTION: This exam was performed according to the departmental dose-optimization program which includes automated exposure control, adjustment of the mA and/or kV according to patient size and/or use of iterative reconstruction technique. CONTRAST:  OMNIPAQUE IOHEXOL 300 MG/ML SOLN IV. No oral contrast. COMPARISON:  CT abdomen and pelvis 03/25/2015, CT pelvis 06/01/2021 FINDINGS: Lower chest: Basilar atelectasis of LEFT lower lobe and lingula. Hepatobiliary: Mild intrahepatic and extrahepatic biliary dilatation, CBD 10 mm diameter. Mild gallbladder wall thickening. No focal hepatic mass. Pancreas: Normal appearance Spleen: Normal appearance. Small splenule inferior to splenic hilum. Adrenals/Urinary Tract: Minimally nodular appearing LEFT adrenal gland stable without discrete mass. RIGHT adrenal gland normal. Small LEFT renal cysts. Questionable bladder wall thickening. No hydronephrosis, hydroureter, or urinary tract calcification. Stomach/Bowel: Normal appendix. Large and small bowel loops unremarkable. Stomach normal appearance. Vascular/Lymphatic: Aorta normal caliber without aneurysm. No adenopathy. Reproductive: Unremarkable prostate gland and seminal vesicles Other: Infiltrative changes in the prevesical space and deep to mildly dilated inguinal canals bilaterally larger on RIGHT. These changes are new since the previous exam. No free air or free fluid. Tiny umbilical hernia containing fat. Musculoskeletal: Degenerative changes with subchondral cystic change at the hip joints bilaterally. Facet degenerative changes lumbar  spine L4-L5 and L5-S1. No acute osseous findings. IMPRESSION: Infiltrative changes in the prevesical space and deep to mildly dilated inguinal canals bilaterally larger on RIGHT with associated mild bladder

## 2022-01-22 NOTE — Progress Notes (Signed)
Verbal order received form Dr. Sherral Hammers to start full liquid diet for pt. ?

## 2022-01-22 NOTE — Consult Note (Signed)
Referring Provider:  TH  ? ?Primary Care Physician:  Ronnald Nian, MD ?Primary Gastroenterologist: Gentry Fitz ? ?Reason for Consultation: Abdominal pain, abnormal CT concerning for cholelithiasis ? ?HPI: Richard Peters is a 61 y.o. male with no significant past medical history except for GERD presented to [facility] with abdominal pain, nausea and vomiting.  Symptoms started few days ago.  Upon initial evaluation yesterday, he was found to have relatively normal CBC, elevated LFTs with AST 325, ALT 600, alkaline phosphatase 180 and T. bili of 5.3.  Previous normal LFTs 8 months ago.  Minimally elevated lipase at 63.  LFTs have improved significantly this morning with T. bili down to 1.5. ? ?CT abdomen pelvis with contrast showed mild intra and extrahepatic biliary dilation with CBD up to 10 mm.  Also showed gallbladder wall thickening.  Normal-appearing pancreas.  Also showed some abnormal findings around urinary bladder. ? ?Patient seen and examined at bedside.  He is feeling significantly better now.  Mild epigastric discomfort.  Denies nausea or vomiting.  Feeling hungry.  Denies diarrhea or constipation.  Denies blood in the stool or black stool. ? ?Last colonoscopy around 7 years ago was normal. ? ?History reviewed. No pertinent past medical history. ? ?History reviewed. No pertinent surgical history. ? ?Prior to Admission medications   ?Medication Sig Start Date End Date Taking? Authorizing Provider  ?acetaminophen (TYLENOL) 500 MG tablet Take 500 mg by mouth every 6 (six) hours as needed for moderate pain.   Yes [provider]  ?Multiple Vitamin (MULTIVITAMIN WITH MINERALS) TABS tablet Take 1 tablet by mouth daily.   Yes [provider]  ?omeprazole (PRILOSEC OTC) 20 MG tablet Take 20 mg by mouth daily as needed (heartburn).   Yes [provider]  ?omeprazole (PRILOSEC) 40 MG capsule Take 1 capsule daily at least 30 minutes before first dose of Carafate. ?Patient not  taking: Reported on 04/28/2021 11/05/16   Molpus, Jonny Ruiz, MD  ?sucralfate (CARAFATE) 1 g tablet Take 1 tablet (1 g total) by mouth 4 (four) times daily -  with meals and at bedtime. ?Patient not taking: Reported on 04/28/2021 11/05/16   Molpus, John, MD  ? ? ?Scheduled Meds: ? multivitamin with minerals  1 tablet Oral Daily  ? ?Continuous Infusions: ? sodium chloride 10 mL/hr at 01/21/22 1443  ? sodium chloride 50 mL/hr at 01/22/22 0528  ? piperacillin-tazobactam (ZOSYN)  IV 3.375 g (01/22/22 0519)  ? ?PRN Meds:.sodium chloride, HYDROmorphone (DILAUDID) injection, ibuprofen, melatonin, ondansetron (ZOFRAN) IV, oxyCODONE, pantoprazole, polyethylene glycol ? ?Allergies as of 01/21/2022  ? (No Known Allergies)  ? ? ?History reviewed. No pertinent family history. ? ?Social History  ? ?Socioeconomic History  ? Marital status: Married  ?  Spouse name: Not on file  ? Number of children: Not on file  ? Years of education: Not on file  ? Highest education level: Not on file  ?Occupational History  ? Not on file  ?Tobacco Use  ? Smoking status: Never  ? Smokeless tobacco: Never  ?Substance and Sexual Activity  ? Alcohol use: No  ? Drug use: No  ? Sexual activity: Not Currently  ?Other Topics Concern  ? Not on file  ?Social History Narrative  ? Not on file  ? ?Social Determinants of Health  ? ?Financial Resource Strain: Not on file  ?Food Insecurity: Not on file  ?Transportation Needs: Not on file  ?Physical Activity: Not on file  ?Stress: Not on file  ?Social Connections: Not on file  ?  Intimate Partner Violence: Not on file  ? ? ?Review of Systems: 12 point review of system is done which is negative except as mentioned in HPI. ? ?Physical Exam: ?Vital signs: ?Vitals:  ? 01/22/22 0159 01/22/22 0517  ?BP: (!) 141/99 (!) 139/93  ?Pulse: 63 63  ?Resp: 18 18  ?Temp: 98.4 ?F (36.9 ?C) 98 ?F (36.7 ?C)  ?SpO2: 98% 98%  ? ?Last BM Date : 01/21/22 ?General:   Alert,  Well-developed, well-nourished, pleasant and cooperative in NAD ?HEENT :  NS, AT, EOMI ?Lungs:  Clear throughout to auscultation.   No wheezes, crackles, or rhonchi. No acute distress. ?Heart:  Regular rate and rhythm; no murmurs, clicks, rubs,  or gallops. ?Abdomen: Mild epigastric tenderness to palpation, abdomen is soft, bowel sounds present, no peritoneal signs ?Neuro -alert and oriented x3 ?Psych -mood and affect normal ?Rectal:  Deferred ? ?GI:  ?Lab Results: ?Recent Labs  ?  01/21/22 ?1109 01/22/22 ?[id number]  ?WBC 3.5* 4.3  ?HGB 13.3 12.8*  ?HCT 40.0 38.6*  ?PLT 252 201  ? ?BMET ?Recent Labs  ?  01/21/22 ?1109 01/22/22 ?[id number]  ?NA 138 139  ?K 3.8 3.7  ?CL 106 108  ?CO2 27 26  ?GLUCOSE 102* 78  ?BUN 9 8  ?CREATININE 1.02 1.12  ?CALCIUM 9.0 8.7*  ? ?LFT ?Recent Labs  ?  01/22/22 ?[id number]  ?PROT 6.7  ?ALBUMIN 3.8  ?AST 147*  ?ALT 439*  ?ALKPHOS 156*  ?BILITOT 1.5*  ? ?PT/INR ?No results for input(s): LABPROT, INR in the last 72 hours. ? ? ?Studies/Results: ?CT ABDOMEN PELVIS W CONTRAST ? ?Result Date: 01/21/2022 ?CLINICAL DATA:  Epigastric pain which began overnight, similar symptoms 2 weeks ago EXAM: CT ABDOMEN AND PELVIS WITH CONTRAST TECHNIQUE: Multidetector CT imaging of the abdomen and pelvis was performed using the standard protocol following bolus administration of intravenous contrast. RADIATION DOSE REDUCTION: This exam was performed according to the departmental dose-optimization program which includes automated exposure control, adjustment of the mA and/or kV according to patient size and/or use of iterative reconstruction technique. CONTRAST:  100mL OMNIPAQUE IOHEXOL 300 MG/ML SOLN IV. No oral contrast. COMPARISON:  CT abdomen and pelvis 03/25/2015, CT pelvis 06/01/2021 FINDINGS: Lower chest: Basilar atelectasis of LEFT lower lobe and lingula. Hepatobiliary: Mild intrahepatic and extrahepatic biliary dilatation, CBD 10 mm diameter. Mild gallbladder wall thickening. No focal hepatic mass. Pancreas: Normal appearance Spleen: Normal appearance. Small splenule inferior to splenic  hilum. Adrenals/Urinary Tract: Minimally nodular appearing LEFT adrenal gland stable without discrete mass. RIGHT adrenal gland normal. Small LEFT renal cysts. Questionable bladder wall thickening. No hydronephrosis, hydroureter, or urinary tract calcification. Stomach/Bowel: Normal appendix. Large and small bowel loops unremarkable. Stomach normal appearance. Vascular/Lymphatic: Aorta normal caliber without aneurysm. No adenopathy. Reproductive: Unremarkable prostate gland and seminal vesicles Other: Infiltrative changes in the prevesical space and deep to mildly dilated inguinal canals bilaterally larger on RIGHT. These changes are new since the previous exam. No free air or free fluid. Tiny umbilical hernia containing fat. Musculoskeletal: Degenerative changes with subchondral cystic change at the hip joints bilaterally. Facet degenerative changes lumbar spine L4-L5 and L5-S1. No acute osseous findings. IMPRESSION: Infiltrative changes in the prevesical space and deep to mildly dilated inguinal canals bilaterally larger on RIGHT with associated mild bladder wall thickening, new since the previous exam; these could represent inflammation/infection or the sequela of trauma/hemorrhage, recommend correlation with patient history and urinalysis. Postsurgical changes could also cause a similar finding though I do not have history of prior herniorrhaphy or recent  surgery. Mild intrahepatic and extrahepatic biliary dilatation, CBD 10 mm diameter, with mild gallbladder wall thickening, recommend correlation with LFTs and consider follow-up RIGHT upper quadrant sonography. Tiny umbilical hernia containing fat. Electronically Signed   By: Ulyses Southward M.D.   On: 01/21/2022 14:18   ? ?Impression/Plan: ?-Abdominal pain with elevated LFTs and CT scan concerning for CBD dilation with CBD of 10 mm as well as gallbladder wall thickening. ?-Elevated LFTs.  Improving.  May have passed  gallstone. ? ?Recommendations ?------------------------- ?-Await MRI MRCP. ?-If MRI MRCP showed CBD stone, he will need ERCP in the next few days. ?-Regardless of MRI MRCP findings, he will benefit from cholecystectomy. ?-Okay to have full liquid diet aft

## 2022-01-23 DIAGNOSIS — K8020 Calculus of gallbladder without cholecystitis without obstruction: Secondary | ICD-10-CM | POA: Diagnosis present

## 2022-01-23 DIAGNOSIS — R1013 Epigastric pain: Secondary | ICD-10-CM | POA: Diagnosis not present

## 2022-01-23 LAB — CBC WITH DIFFERENTIAL/PLATELET
Abs Immature Granulocytes: 0 10*3/uL (ref 0.00–0.07)
Basophils Absolute: 0 10*3/uL (ref 0.0–0.1)
Basophils Relative: 1 %
Eosinophils Absolute: 0.2 10*3/uL (ref 0.0–0.5)
Eosinophils Relative: 6 %
HCT: 43.1 % (ref 39.0–52.0)
Hemoglobin: 14 g/dL (ref 13.0–17.0)
Immature Granulocytes: 0 %
Lymphocytes Relative: 36 %
Lymphs Abs: 1.4 10*3/uL (ref 0.7–4.0)
MCH: 27.6 pg (ref 26.0–34.0)
MCHC: 32.5 g/dL (ref 30.0–36.0)
MCV: 85 fL (ref 80.0–100.0)
Monocytes Absolute: 0.3 10*3/uL (ref 0.1–1.0)
Monocytes Relative: 9 %
Neutro Abs: 1.8 10*3/uL (ref 1.7–7.7)
Neutrophils Relative %: 48 %
Platelets: 236 10*3/uL (ref 150–400)
RBC: 5.07 MIL/uL (ref 4.22–5.81)
RDW: 15.4 % (ref 11.5–15.5)
WBC: 3.8 10*3/uL — ABNORMAL LOW (ref 4.0–10.5)
nRBC: 0 % (ref 0.0–0.2)

## 2022-01-23 LAB — COMPREHENSIVE METABOLIC PANEL
ALT: 354 U/L — ABNORMAL HIGH (ref 0–44)
AST: 107 U/L — ABNORMAL HIGH (ref 15–41)
Albumin: 3.9 g/dL (ref 3.5–5.0)
Alkaline Phosphatase: 150 U/L — ABNORMAL HIGH (ref 38–126)
Anion gap: 6 (ref 5–15)
BUN: 9 mg/dL (ref 6–20)
CO2: 27 mmol/L (ref 22–32)
Calcium: 9.2 mg/dL (ref 8.9–10.3)
Chloride: 111 mmol/L (ref 98–111)
Creatinine, Ser: 1.1 mg/dL (ref 0.61–1.24)
GFR, Estimated: >60 mL/min (ref >60)
Glucose, Bld: 76 mg/dL (ref 70–99)
Potassium: 3.9 mmol/L (ref 3.5–5.1)
Sodium: 144 mmol/L (ref 135–145)
Total Bilirubin: 1.6 mg/dL — ABNORMAL HIGH (ref 0.3–1.2)
Total Protein: 7.1 g/dL (ref 6.5–8.1)

## 2022-01-23 LAB — PHOSPHORUS: Phosphorus: 3 mg/dL (ref 2.5–4.6)

## 2022-01-23 LAB — HIV ANTIBODY (ROUTINE TESTING W REFLEX): HIV Screen 4th Generation wRfx: NONREACTIVE

## 2022-01-23 LAB — SURGICAL PCR SCREEN
MRSA, PCR: NEGATIVE
Staphylococcus aureus: NEGATIVE

## 2022-01-23 LAB — MAGNESIUM: Magnesium: 2 mg/dL (ref 1.7–2.4)

## 2022-01-23 NOTE — Progress Notes (Signed)
?PROGRESS NOTE ? ? ? ?Richard Peters  DGL:[id number] DOB: 10/17/1960 DOA: 01/21/2022 ?PCP: Ronnald Nian, MD  ? ? ? ?Brief Narrative:  ?61 y.o. BM PMHx GERD  ? ?Presented initially to the emergent care due to worsening upper abdominal pain associated with intermittent nausea and vomiting x1, 4 days ago, 1 episode of loose stool on the day of presentation.  Symptoms started less than a week ago at that time he had vomited x1 then his symptoms subsided.  They recurred on Tuesday 4 days ago, and gradually got worse.  The night prior to presentation, his abdominal pain was quite severe causing him to have minimal oral intake.  The next morning he presented to emergent care, blood work revealed abnormal LFTs, therefore was sent to [facility] ED for further evaluation.  In the ED, CT scan abdomen and pelvis with contrast reveals dilated common bile duct with concern for choledocholithiasis.  EDP discussed case with GI who recommended MRCP and will see in consultation.  General surgery was also consulted by EDP.  TRH, hospitalist team, was asked to admit. ?  ?ED Course: Tmax 98.7.  BP 156/92, pulse 64, respiration rate 15, oxygen saturation 100% on room air.  Lab studies remarkable for alkaline phosphatase 180, lipase 63, AST 325, ALT 600, T. bili [name] 5.3.  WBC 3.5, hemoglobin 13.3, platelet 252.  CT abdomen pelvis with contrast showing mild gallbladder wall thickening, common bile duct measuring 10 mm in diameter ? ? ?Subjective: ?5/14 afebrile overnight A/O x4, negative abdominal pain. ? ? ?Assessment & Plan: ?Covid vaccination; ?  ?Principal Problem: ?  Abdominal pain ?Active Problems: ?  Cholelithiasis ? ? ? Abdominal pain with concern for biliary obstruction, possible choledocholithiasis ?Presented with upper abdominal pain mostly epigastric region ?Elevated liver chemistries, T bilirubin 5.3, lipase minimally elevated 63, repeat in the morning due to the location of his pain.  Normal appearance of the pancreas on CT  scan. ?-Common bile duct dilatation 10 mm in diameter on CT scan ?-Pain management and bowel regimen in place ?-Continue antibiotics per GI/surgery ? ?Cholelithiasis ?- 5/13 MRCP positive cholelithiasis 2 cm gallstone: See study below ?-5/14 GI has signed off ?- 5/15 [facility] Plan is for cholecystectomy ?  ?Elevated BP ?-Negative history HTN, most likely pain related ?- 5/14 resolved  ?  ?GERD ?- Protonix 40 mg daily  ? ? ?  ?Mobility Assessment (last 72 hours)   ? ? Mobility Assessment   ? ? Row Name 01/23/22 0845 01/22/22 2102 01/22/22 0800 01/21/22 1952 01/21/22 1948  ? Does patient have an order for bedrest or is patient medically unstable No - Continue assessment No - Continue assessment No - Continue assessment No - Continue assessment No - Continue assessment  ? What is the highest level of mobility based on the progressive mobility assessment? Level 6 (Walks independently in room and hall) - Balance while walking in room without assist - Complete Level 6 (Walks independently in room and hall) - Balance while walking in room without assist - Complete Level 6 (Walks independently in room and hall) - Balance while walking in room without assist - Complete Level 6 (Walks independently in room and hall) - Balance while walking in room without assist - Complete Level 6 (Walks independently in room and hall) - Balance while walking in room without assist - Complete  ? ?  ?  ? ?  ? ? ? ?@IPAL @ ? ?   ?DVT prophylaxis: SCD ?Code Status: Full ?Family Communication: 5/14  wife at bedside for discussion of plan of care all questions answered ?Status is: Inpatient ? ? ? ?Dispo: The patient is from: Home ?             Anticipated d/c is to: Home ?             Anticipated d/c date is: 3 days ?             Patient currently is not medically stable to d/c. ? ? ? ? ? ?Consultants:  ?Dr. Levora AngelBrahmbhatt GI ?Dr. Chevis PrettyPaul Toth, [facility] ? ? ?Procedures/Significant Events:  ?5/13 MRCP: ?- Cholelithiasis with mild gallbladder wall thickening.  ?-No  definite choledocholithiasis or significant biliary ductal dilatation identified, given limitations of motion. ?-Trace bilateral pleural effusions. Atelectatic changes at the left lung base. ? ? ?I have personally reviewed and interpreted all radiology studies and my findings are as above. ? ?VENTILATOR SETTINGS: ? ? ? ?Cultures ?5/13 influenza A/B negative ?5/13 SARS coronavirus negative ? ? ? ?Antimicrobials: ?Anti-infectives (From admission, onward)  ? ? Start     Ordered Stop  ? 01/21/22 2200  piperacillin-tazobactam (ZOSYN) IVPB 3.375 g       ? 01/21/22 1446    ? 01/21/22 1430  piperacillin-tazobactam (ZOSYN) IVPB 3.375 g       ? 01/21/22 1429 01/21/22 1544  ? ?  ?  ? ? ?Devices ?  ? ?LINES / TUBES:  ? ? ? ? ?Continuous Infusions: ? sodium chloride 10 mL/hr at 01/21/22 1443  ? sodium chloride 50 mL/hr at 01/23/22 0257  ? piperacillin-tazobactam (ZOSYN)  IV 3.375 g (01/23/22 1406)  ? ? ? ?Objective: ?Vitals:  ? 01/22/22 1356 01/22/22 2100 01/23/22 0530 01/23/22 1403  ?BP: (!) 126/91 122/86 (!) 145/92 121/87  ?Pulse: 68 69 (!) 59 74  ?Resp: 19 16 16 18   ?Temp: 98.1 ?F (36.7 ?C) 98.3 ?F (36.8 ?C) 98.2 ?F (36.8 ?C)   ?TempSrc: Oral Oral Oral   ?SpO2: 96% 99% 97% 96%  ?Weight:      ?Height:      ? ? ?Intake/Output Summary (Last 24 hours) at 01/23/2022 1756 ?Last data filed at 01/23/2022 1500 ?Gross per 24 hour  ?Intake 1826.91 ml  ?Output 0 ml  ?Net 1826.91 ml  ? ?Filed Weights  ? 01/21/22 1057  ?Weight: 99.8 kg  ? ? ?Examination: ? ?General: A/O x4, No acute respiratory distress ?Eyes: negative scleral hemorrhage, negative anisocoria, negative icterus ?ENT: Negative Runny nose, negative gingival bleeding, ?Neck:  Negative scars, masses, torticollis, lymphadenopathy, JVD ?Lungs: Clear to auscultation bilaterally without wheezes or crackles ?Cardiovascular: Regular rate and rhythm without murmur gallop or rub normal S1 and S2 ?Abdomen: negative abdominal pain, nondistended, positive soft, bowel sounds, no rebound, no  ascites, no appreciable mass ?Extremities: No significant cyanosis, clubbing, or edema bilateral lower extremities ?Skin: Negative rashes, lesions, ulcers ?Psychiatric:  Negative depression, negative anxiety, negative fatigue, negative mania  ?Central nervous system:  Cranial nerves II through XII intact, tongue/uvula midline, all extremities muscle strength 5/5, sensation intact throughout, negative dysarthria, negative expressive aphasia, negative receptive aphasia. ? ?.  ? ? ? ?Data Reviewed: Care during the described time interval was provided by me .  I have reviewed this patient's available data, including medical history, events of note, physical examination, and all test results as part of my evaluation. ? ?CBC: ?Recent Labs  ?Lab 01/21/22 ?1109 01/22/22 ?[id number] 01/23/22 ?0645  ?WBC 3.5* 4.3 3.8*  ?NEUTROABS  --  2.7 1.8  ?HGB 13.3 12.8* 14.0  ?  HCT 40.0 38.6* 43.1  ?MCV 82.5 84.6 85.0  ?PLT 252 201 236  ? ?Basic Metabolic Panel: ?Recent Labs  ?Lab 01/21/22 ?1109 01/22/22 ?9166 01/23/22 ?0645  ?NA 138 139 144  ?K 3.8 3.7 3.9  ?CL 106 108 111  ?CO2 27 26 27   ?GLUCOSE 102* 78 76  ?BUN 9 8 9   ?CREATININE 1.02 1.12 1.10  ?CALCIUM 9.0 8.7* 9.2  ?MG  --  2.0 2.0  ?PHOS  --  3.2 3.0  ? ?GFR: ?Estimated Creatinine Clearance: 87.4 mL/min (by C-G formula based on SCr of 1.1 mg/dL). ?Liver Function Tests: ?Recent Labs  ?Lab 01/21/22 ?1109 01/22/22 ?03/23/22 01/23/22 ?0645  ?AST 325* 147* 107*  ?ALT 600* 439* 354*  ?ALKPHOS 180* 156* 150*  ?BILITOT 5.3* 1.5* 1.6*  ?PROT 7.0 6.7 7.1  ?ALBUMIN 3.9 3.8 3.9  ? ?Recent Labs  ?Lab 01/21/22 ?1109  ?LIPASE 63*  ? ?No results for input(s): AMMONIA in the last 168 hours. ?Coagulation Profile: ?No results for input(s): INR, PROTIME in the last 168 hours. ?Cardiac Enzymes: ?No results for input(s): CKTOTAL, CKMB, CKMBINDEX, TROPONINI in the last 168 hours. ?BNP (last 3 results) ?No results for input(s): PROBNP in the last 8760 hours. ?HbA1C: ?No results for input(s): HGBA1C in the last 72  hours. ?CBG: ?No results for input(s): GLUCAP in the last 168 hours. ?Lipid Profile: ?No results for input(s): CHOL, HDL, LDLCALC, TRIG, CHOLHDL, LDLDIRECT in the last 72 hours. ?Thyroid Function Tests: ?No

## 2022-01-23 NOTE — Progress Notes (Signed)
? ?Subjective/Chief Complaint: ?Feeling better today ? ? ?Objective: ?Vital signs in last 24 hours: ?Temp:  [98.1 ?F (36.7 ?C)-98.3 ?F (36.8 ?C)] 98.2 ?F (36.8 ?C) (05/14 0530) ?Pulse Rate:  [59-69] 59 (05/14 0530) ?Resp:  [16-19] 16 (05/14 0530) ?BP: (122-145)/(86-92) 145/92 (05/14 0530) ?SpO2:  [96 %-99 %] 97 % (05/14 0530) ?Last BM Date : 01/22/22 ? ?Intake/Output from previous day: ?05/13 0701 - 05/14 0700 ?In: 1795.4 [P.O.:480; I.V.:1124.8; IV Piggyback:190.7] ?Out: 0  ?Intake/Output this shift: ?No intake/output data recorded. ? ?General appearance: alert and cooperative ?Resp: clear to auscultation bilaterally ?Cardio: regular rate and rhythm ?GI: soft, minimal tenderness ? ?Lab Results:  ?Recent Labs  ?  01/22/22 ?[id number] 01/23/22 ?0645  ?WBC 4.3 3.8*  ?HGB 12.8* 14.0  ?HCT 38.6* 43.1  ?PLT 201 236  ? ?BMET ?Recent Labs  ?  01/22/22 ?[id number] 01/23/22 ?0645  ?NA 139 144  ?K 3.7 3.9  ?CL 108 111  ?CO2 26 27  ?GLUCOSE 78 76  ?BUN 8 9  ?CREATININE 1.12 1.10  ?CALCIUM 8.7* 9.2  ? ?PT/INR ?No results for input(s): LABPROT, INR in the last 72 hours. ?ABG ?No results for input(s): PHART, HCO3 in the last 72 hours. ? ?Invalid input(s): PCO2, PO2 ? ?Studies/Results: ?CT ABDOMEN PELVIS W CONTRAST ? ?Result Date: 01/21/2022 ?CLINICAL DATA:  Epigastric pain which began overnight, similar symptoms 2 weeks ago EXAM: CT ABDOMEN AND PELVIS WITH CONTRAST TECHNIQUE: Multidetector CT imaging of the abdomen and pelvis was performed using the standard protocol following bolus administration of intravenous contrast. RADIATION DOSE REDUCTION: This exam was performed according to the departmental dose-optimization program which includes automated exposure control, adjustment of the mA and/or kV according to patient size and/or use of iterative reconstruction technique. CONTRAST:  138mL OMNIPAQUE IOHEXOL 300 MG/ML SOLN IV. No oral contrast. COMPARISON:  CT abdomen and pelvis 03/25/2015, CT pelvis 06/01/2021 FINDINGS: Lower chest: Basilar  atelectasis of LEFT lower lobe and lingula. Hepatobiliary: Mild intrahepatic and extrahepatic biliary dilatation, CBD 10 mm diameter. Mild gallbladder wall thickening. No focal hepatic mass. Pancreas: Normal appearance Spleen: Normal appearance. Small splenule inferior to splenic hilum. Adrenals/Urinary Tract: Minimally nodular appearing LEFT adrenal gland stable without discrete mass. RIGHT adrenal gland normal. Small LEFT renal cysts. Questionable bladder wall thickening. No hydronephrosis, hydroureter, or urinary tract calcification. Stomach/Bowel: Normal appendix. Large and small bowel loops unremarkable. Stomach normal appearance. Vascular/Lymphatic: Aorta normal caliber without aneurysm. No adenopathy. Reproductive: Unremarkable prostate gland and seminal vesicles Other: Infiltrative changes in the prevesical space and deep to mildly dilated inguinal canals bilaterally larger on RIGHT. These changes are new since the previous exam. No free air or free fluid. Tiny umbilical hernia containing fat. Musculoskeletal: Degenerative changes with subchondral cystic change at the hip joints bilaterally. Facet degenerative changes lumbar spine L4-L5 and L5-S1. No acute osseous findings. IMPRESSION: Infiltrative changes in the prevesical space and deep to mildly dilated inguinal canals bilaterally larger on RIGHT with associated mild bladder wall thickening, new since the previous exam; these could represent inflammation/infection or the sequela of trauma/hemorrhage, recommend correlation with patient history and urinalysis. Postsurgical changes could also cause a similar finding though I do not have history of prior herniorrhaphy or recent surgery. Mild intrahepatic and extrahepatic biliary dilatation, CBD 10 mm diameter, with mild gallbladder wall thickening, recommend correlation with LFTs and consider follow-up RIGHT upper quadrant sonography. Tiny umbilical hernia containing fat. Electronically Signed   By: Lavonia Dana M.D.   On: 01/21/2022 14:18  ? ?MR 3D Recon At Scanner ? ?  Result Date: 01/22/2022 ?CLINICAL DATA:  Right upper quadrant pain EXAM: MRI ABDOMEN WITHOUT AND WITH CONTRAST (INCLUDING MRCP) TECHNIQUE: Multiplanar multisequence MR imaging of the abdomen was performed both before and after the administration of intravenous contrast. Heavily T2-weighted images of the biliary and pancreatic ducts were obtained, and three-dimensional MRCP images were rendered by post processing. CONTRAST:  63mL GADAVIST GADOBUTROL 1 MMOL/ML IV SOLN COMPARISON:  CT abdomen 01/21/2022 FINDINGS: Study is significantly limited due to motion. Lower chest: Trace bilateral pleural effusions. Atelectatic changes at the left lung base. Elevated left hemidiaphragm. Hepatobiliary: Liver is normal in size and contour with no suspicious mass identified. No evidence of hepatic steatosis. 2 cm gallstone. Mild gallbladder wall thickening. Changes of focal adenomyomatosis at the fundus of the gallbladder noted. No significant biliary ductal dilatation identified. Common bile duct is upper normal caliber measuring 6.5 mm in diameter. No choledocholithiasis visualized given limitations. Pancreas: No mass, inflammatory changes, or other parenchymal abnormality identified. Spleen:  Within normal limits in size and appearance. Adrenals/Urinary Tract: Adrenal glands appear normal. There appear to be a few small renal cortical cysts bilaterally. No suspicious renal mass or hydronephrosis appreciated. Stomach/Bowel: Visualized portions within the abdomen are unremarkable. Vascular/Lymphatic: No pathologically enlarged lymph nodes identified. No abdominal aortic aneurysm demonstrated. Other:  No ascites. Musculoskeletal: No suspicious bone lesions identified. IMPRESSION: 1. Cholelithiasis with mild gallbladder wall thickening. Correlate clinically. 2. No definite choledocholithiasis or significant biliary ductal dilatation identified, given limitations of motion.  3. Trace bilateral pleural effusions. Atelectatic changes at the left lung base. Electronically Signed   By: Ofilia Neas M.D.   On: 01/22/2022 17:42  ? ?MR ABDOMEN MRCP W WO CONTAST ? ?Result Date: 01/22/2022 ?CLINICAL DATA:  Right upper quadrant pain EXAM: MRI ABDOMEN WITHOUT AND WITH CONTRAST (INCLUDING MRCP) TECHNIQUE: Multiplanar multisequence MR imaging of the abdomen was performed both before and after the administration of intravenous contrast. Heavily T2-weighted images of the biliary and pancreatic ducts were obtained, and three-dimensional MRCP images were rendered by post processing. CONTRAST:  80mL GADAVIST GADOBUTROL 1 MMOL/ML IV SOLN COMPARISON:  CT abdomen 01/21/2022 FINDINGS: Study is significantly limited due to motion. Lower chest: Trace bilateral pleural effusions. Atelectatic changes at the left lung base. Elevated left hemidiaphragm. Hepatobiliary: Liver is normal in size and contour with no suspicious mass identified. No evidence of hepatic steatosis. 2 cm gallstone. Mild gallbladder wall thickening. Changes of focal adenomyomatosis at the fundus of the gallbladder noted. No significant biliary ductal dilatation identified. Common bile duct is upper normal caliber measuring 6.5 mm in diameter. No choledocholithiasis visualized given limitations. Pancreas: No mass, inflammatory changes, or other parenchymal abnormality identified. Spleen:  Within normal limits in size and appearance. Adrenals/Urinary Tract: Adrenal glands appear normal. There appear to be a few small renal cortical cysts bilaterally. No suspicious renal mass or hydronephrosis appreciated. Stomach/Bowel: Visualized portions within the abdomen are unremarkable. Vascular/Lymphatic: No pathologically enlarged lymph nodes identified. No abdominal aortic aneurysm demonstrated. Other:  No ascites. Musculoskeletal: No suspicious bone lesions identified. IMPRESSION: 1. Cholelithiasis with mild gallbladder wall thickening. Correlate  clinically. 2. No definite choledocholithiasis or significant biliary ductal dilatation identified, given limitations of motion. 3. Trace bilateral pleural effusions. Atelectatic changes at the left [facility]

## 2022-01-23 NOTE — Progress Notes (Signed)
[facility] Progress Note ? ?Sachin Mcclenney 61 y.o. May 12, 1961 ? ?CC: Abdominal pain, abnormal LFTs ? ? ?Subjective: ?Patient seen and examined at bedside.  Feeling better.  Abdominal pain has improved but not completely resolved.  Denies nausea and vomiting. ? ?ROS : Afebrile, negative for chest pain ? ? ?Objective: ?Vital signs in last 24 hours: ?Vitals:  ? 01/22/22 2100 01/23/22 0530  ?BP: 122/86 (!) 145/92  ?Pulse: 69 (!) 59  ?Resp: 16 16  ?Temp: 98.3 ?F (36.8 ?C) 98.2 ?F (36.8 ?C)  ?SpO2: 99% 97%  ? ? ?Physical Exam: ? ?General:  Alert, cooperative, no distress, appears stated age  ?Head:  Normocephalic, without obvious abnormality, atraumatic  ?Eyes:  , EOM's intact,   ?Lungs:   Clear to auscultation bilaterally, respirations unlabored  ?Heart:  Regular rate and rhythm, S1, S2 normal  ?Abdomen:   Soft, mild epigastric and right upper quadrant tenderness to palpation, bowel sounds present, no peritoneal signs  ?Extremities: Extremities normal, atraumatic, no  edema  ?Pulses: 2+ and symmetric  ? ? ?Lab Results: ?Recent Labs  ?  01/22/22 ?2505 01/23/22 ?0645  ?NA 139 144  ?K 3.7 3.9  ?CL 108 111  ?CO2 26 27  ?GLUCOSE 78 76  ?BUN 8 9  ?CREATININE 1.12 1.10  ?CALCIUM 8.7* 9.2  ?MG 2.0 2.0  ?PHOS 3.2 3.0  ? ?Recent Labs  ?  01/22/22 ?3976 01/23/22 ?0645  ?AST 147* 107*  ?ALT 439* 354*  ?ALKPHOS 156* 150*  ?BILITOT 1.5* 1.6*  ?PROT 6.7 7.1  ?ALBUMIN 3.8 3.9  ? ?Recent Labs  ?  01/22/22 ?7341 01/23/22 ?0645  ?WBC 4.3 3.8*  ?NEUTROABS 2.7 1.8  ?HGB 12.8* 14.0  ?HCT 38.6* 43.1  ?MCV 84.6 85.0  ?PLT 201 236  ? ?No results for input(s): LABPROT, INR in the last 72 hours. ? ? ? ?Assessment/Plan: ?-Abdominal pain with elevated LFTs and CT scan concerning for CBD dilation with CBD of 10 mm as well as gallbladder wall thickening.  MRI negative for choledocholithiasis..  CBD 6.5 mm in MRI. ?-Elevated LFTs.  Improving.  May have passed gallstone. ?  ?Recommendations ?------------------------- ?-MRI MRCP negative for  choledocholithiasis.  Did showed large, 2 cm gallstone and mild gallbladder wall thickening.  Also showed adenomyomatosis at the gallbladder fundus. ? ?-Surgery is planning for cholecystectomy ? ?-No further inpatient GI work-up planned. ? ?-GI will sign off.  Call us back if needed ? ? ?Kathi Der MD, FACP ?01/23/2022, 10:39 AM ? ?Contact #  [phone]  ?

## 2022-01-23 NOTE — Progress Notes (Signed)
?  Transition of Care (TOC) Screening Note ? ? ?Patient Details  ?Name: Richard Peters ?Date of Birth: 1960/11/02 ? ? ?Transition of Care (TOC) CM/SW Contact:    ?Dara Beidleman, LCSW ?Phone Number: ?01/23/2022, 9:10 AM ? ? ? ?Transition of Care Department [facility]) has reviewed patient and no TOC needs have been identified at this time. We will continue to monitor patient advancement through interdisciplinary progression rounds. If new patient transition needs arise, please place a TOC consult. ? ? ?

## 2022-01-24 ENCOUNTER — Inpatient Hospital Stay (HOSPITAL_COMMUNITY): Payer: BC Managed Care – PPO

## 2022-01-24 ENCOUNTER — Other Ambulatory Visit: Payer: Self-pay

## 2022-01-24 ENCOUNTER — Inpatient Hospital Stay (HOSPITAL_COMMUNITY): Payer: BC Managed Care – PPO | Admitting: Certified Registered Nurse Anesthetist

## 2022-01-24 ENCOUNTER — Encounter (HOSPITAL_COMMUNITY): Payer: Self-pay | Admitting: Internal Medicine

## 2022-01-24 ENCOUNTER — Encounter (HOSPITAL_COMMUNITY)
Admission: EM | Disposition: A | Discharge status: Home / Self Care | Payer: Self-pay | Source: Home / Self Care | Attending: Internal Medicine

## 2022-01-24 DIAGNOSIS — R1013 Epigastric pain: Secondary | ICD-10-CM | POA: Diagnosis not present

## 2022-01-24 DIAGNOSIS — K8020 Calculus of gallbladder without cholecystitis without obstruction: Secondary | ICD-10-CM | POA: Diagnosis not present

## 2022-01-24 DIAGNOSIS — R748 Abnormal levels of other serum enzymes: Secondary | ICD-10-CM | POA: Diagnosis present

## 2022-01-24 DIAGNOSIS — K8080 Other cholelithiasis without obstruction: Secondary | ICD-10-CM

## 2022-01-24 HISTORY — PX: CHOLECYSTECTOMY: SHX55

## 2022-01-24 LAB — COMPREHENSIVE METABOLIC PANEL
ALT: 281 U/L — ABNORMAL HIGH (ref 0–44)
AST: 86 U/L — ABNORMAL HIGH (ref 15–41)
Albumin: 3.6 g/dL (ref 3.5–5.0)
Alkaline Phosphatase: 116 U/L (ref 38–126)
Anion gap: 7 (ref 5–15)
BUN: 11 mg/dL (ref 6–20)
CO2: 27 mmol/L (ref 22–32)
Calcium: 8.8 mg/dL — ABNORMAL LOW (ref 8.9–10.3)
Chloride: 107 mmol/L (ref 98–111)
Creatinine, Ser: 1.2 mg/dL (ref 0.61–1.24)
GFR, Estimated: >60 mL/min (ref >60)
Glucose, Bld: 99 mg/dL (ref 70–99)
Potassium: 4.1 mmol/L (ref 3.5–5.1)
Sodium: 141 mmol/L (ref 135–145)
Total Bilirubin: 0.9 mg/dL (ref 0.3–1.2)
Total Protein: 6.4 g/dL — ABNORMAL LOW (ref 6.5–8.1)

## 2022-01-24 LAB — CBC WITH DIFFERENTIAL/PLATELET
Abs Immature Granulocytes: 0.01 10*3/uL (ref 0.00–0.07)
Basophils Absolute: 0 10*3/uL (ref 0.0–0.1)
Basophils Relative: 1 %
Eosinophils Absolute: 0.2 10*3/uL (ref 0.0–0.5)
Eosinophils Relative: 5 %
HCT: 38.6 % — ABNORMAL LOW (ref 39.0–52.0)
Hemoglobin: 12.9 g/dL — ABNORMAL LOW (ref 13.0–17.0)
Immature Granulocytes: 0 %
Lymphocytes Relative: 38 %
Lymphs Abs: 1.6 10*3/uL (ref 0.7–4.0)
MCH: 28.2 pg (ref 26.0–34.0)
MCHC: 33.4 g/dL (ref 30.0–36.0)
MCV: 84.5 fL (ref 80.0–100.0)
Monocytes Absolute: 0.4 10*3/uL (ref 0.1–1.0)
Monocytes Relative: 10 %
Neutro Abs: 1.9 10*3/uL (ref 1.7–7.7)
Neutrophils Relative %: 46 %
Platelets: 216 10*3/uL (ref 150–400)
RBC: 4.57 MIL/uL (ref 4.22–5.81)
RDW: 15.4 % (ref 11.5–15.5)
WBC: 4.1 10*3/uL (ref 4.0–10.5)
nRBC: 0 % (ref 0.0–0.2)

## 2022-01-24 LAB — MAGNESIUM: Magnesium: 2 mg/dL (ref 1.7–2.4)

## 2022-01-24 LAB — PHOSPHORUS: Phosphorus: 2.8 mg/dL (ref 2.5–4.6)

## 2022-01-24 SURGERY — LAPAROSCOPIC CHOLECYSTECTOMY WITH INTRAOPERATIVE CHOLANGIOGRAM
Anesthesia: General

## 2022-01-24 MED ORDER — FENTANYL CITRATE (PF) 100 MCG/2ML IJ SOLN
INTRAMUSCULAR | Status: AC
  Filled 2022-01-24: qty 2

## 2022-01-24 MED ORDER — MIDAZOLAM HCL 2 MG/2ML IJ SOLN: 0.5000 mg | Freq: Once | INTRAMUSCULAR | Status: DC | PRN

## 2022-01-24 MED ORDER — ACETAMINOPHEN 500 MG PO TABS
1000.0000 mg | ORAL_TABLET | Freq: Four times a day (QID) | ORAL | Status: DC
  Administered 2022-01-24 – 2022-01-25 (×3): 1000 mg via ORAL
  Filled 2022-01-24 (×2): qty 2

## 2022-01-24 MED ORDER — LIDOCAINE HCL (PF) 2 % IJ SOLN
INTRAMUSCULAR | Status: AC
  Filled 2022-01-24: qty 5

## 2022-01-24 MED ORDER — PROPOFOL 10 MG/ML IV BOLUS
INTRAVENOUS | Status: DC | PRN
  Administered 2022-01-24: 200 mg via INTRAVENOUS

## 2022-01-24 MED ORDER — PROPOFOL 10 MG/ML IV BOLUS
INTRAVENOUS | Status: AC
  Filled 2022-01-24: qty 20

## 2022-01-24 MED ORDER — ROCURONIUM BROMIDE 10 MG/ML (PF) SYRINGE
PREFILLED_SYRINGE | INTRAVENOUS | Status: DC | PRN
Start: 2022-01-24 — End: 2022-01-24
  Administered 2022-01-24: 70 mg via INTRAVENOUS

## 2022-01-24 MED ORDER — PHENYLEPHRINE 80 MCG/ML (10ML) SYRINGE FOR IV PUSH (FOR BLOOD PRESSURE SUPPORT)
PREFILLED_SYRINGE | INTRAVENOUS | Status: AC
  Filled 2022-01-24: qty 10

## 2022-01-24 MED ORDER — HYDROMORPHONE HCL 1 MG/ML IJ SOLN
0.2500 mg | INTRAMUSCULAR | Status: DC | PRN
  Administered 2022-01-24 (×3): 0.5 mg via INTRAVENOUS

## 2022-01-24 MED ORDER — DEXAMETHASONE SODIUM PHOSPHATE 10 MG/ML IJ SOLN
INTRAMUSCULAR | Status: DC | PRN
  Administered 2022-01-24: 5 mg via INTRAVENOUS

## 2022-01-24 MED ORDER — OXYCODONE HCL 5 MG PO TABS
ORAL_TABLET | ORAL | Status: AC
  Filled 2022-01-24: qty 1

## 2022-01-24 MED ORDER — DEXAMETHASONE SODIUM PHOSPHATE 10 MG/ML IJ SOLN
INTRAMUSCULAR | Status: AC
  Filled 2022-01-24: qty 1

## 2022-01-24 MED ORDER — OXYCODONE HCL 5 MG PO TABS
5.0000 mg | ORAL_TABLET | Freq: Once | ORAL | Status: AC | PRN
  Administered 2022-01-24: 5 mg via ORAL

## 2022-01-24 MED ORDER — LIDOCAINE 2% (20 MG/ML) 5 ML SYRINGE
INTRAMUSCULAR | Status: DC | PRN
Start: 2022-01-24 — End: 2022-01-24
  Administered 2022-01-24: 30 mg via INTRAVENOUS

## 2022-01-24 MED ORDER — HYDROMORPHONE HCL 1 MG/ML IJ SOLN
INTRAMUSCULAR | Status: AC
  Administered 2022-01-24: 0.5 mg via INTRAVENOUS
  Filled 2022-01-24: qty 2

## 2022-01-24 MED ORDER — ONDANSETRON HCL 4 MG/2ML IJ SOLN
INTRAMUSCULAR | Status: AC
  Filled 2022-01-24: qty 2

## 2022-01-24 MED ORDER — MEPERIDINE HCL 50 MG/ML IJ SOLN: 6.2500 mg | INTRAMUSCULAR | Status: DC | PRN

## 2022-01-24 MED ORDER — ONDANSETRON HCL 4 MG/2ML IJ SOLN
INTRAMUSCULAR | Status: DC | PRN
  Administered 2022-01-24: 4 mg via INTRAVENOUS

## 2022-01-24 MED ORDER — HYDROMORPHONE HCL 1 MG/ML IJ SOLN
INTRAMUSCULAR | Status: AC
  Administered 2022-01-24: 0.5 mg via INTRAVENOUS
  Filled 2022-01-24: qty 1

## 2022-01-24 MED ORDER — ROCURONIUM BROMIDE 10 MG/ML (PF) SYRINGE
PREFILLED_SYRINGE | INTRAVENOUS | Status: AC
  Filled 2022-01-24: qty 10

## 2022-01-24 MED ORDER — CHLORHEXIDINE GLUCONATE 0.12 % MT SOLN
15.0000 mL | Freq: Once | OROMUCOSAL | Status: AC
  Administered 2022-01-24: 15 mL via OROMUCOSAL

## 2022-01-24 MED ORDER — PHENYLEPHRINE 80 MCG/ML (10ML) SYRINGE FOR IV PUSH (FOR BLOOD PRESSURE SUPPORT)
PREFILLED_SYRINGE | INTRAVENOUS | Status: DC | PRN
  Administered 2022-01-24: 80 ug via INTRAVENOUS

## 2022-01-24 MED ORDER — OXYCODONE HCL 5 MG/5ML PO SOLN: 5.0000 mg | Freq: Once | ORAL | Status: AC | PRN

## 2022-01-24 MED ORDER — SUGAMMADEX SODIUM 200 MG/2ML IV SOLN
INTRAVENOUS | Status: DC | PRN
  Administered 2022-01-24: 200 mg via INTRAVENOUS

## 2022-01-24 MED ORDER — FENTANYL CITRATE (PF) 100 MCG/2ML IJ SOLN
INTRAMUSCULAR | Status: DC | PRN
  Administered 2022-01-24 (×2): 50 ug via INTRAVENOUS
  Administered 2022-01-24: 100 ug via INTRAVENOUS

## 2022-01-24 MED ORDER — SODIUM CHLORIDE 0.9 % IV SOLN
INTRAVENOUS | Status: DC | PRN
  Administered 2022-01-24: 8 mL

## 2022-01-24 MED ORDER — MIDAZOLAM HCL 5 MG/5ML IJ SOLN
INTRAMUSCULAR | Status: DC | PRN
  Administered 2022-01-24: 2 mg via INTRAVENOUS

## 2022-01-24 MED ORDER — LACTATED RINGERS IV SOLN
INTRAVENOUS | Status: DC
Start: 2022-01-24 — End: 2022-01-24

## 2022-01-24 MED ORDER — BUPIVACAINE-EPINEPHRINE (PF) 0.25% -1:200000 IJ SOLN
INTRAMUSCULAR | Status: AC
  Filled 2022-01-24: qty 30

## 2022-01-24 MED ORDER — ACETAMINOPHEN 500 MG PO TABS: 1000.0000 mg | ORAL_TABLET | Freq: Once | ORAL | Status: DC

## 2022-01-24 MED ORDER — BUPIVACAINE-EPINEPHRINE 0.25% -1:200000 IJ SOLN
INTRAMUSCULAR | Status: DC | PRN
  Administered 2022-01-24: 12 mL

## 2022-01-24 MED ORDER — MIDAZOLAM HCL 2 MG/2ML IJ SOLN
INTRAMUSCULAR | Status: AC
Start: 2022-01-24 — End: ?
  Filled 2022-01-24: qty 2

## 2022-01-24 MED ORDER — HYDROMORPHONE HCL 1 MG/ML IJ SOLN
0.2500 mg | INTRAMUSCULAR | Status: DC | PRN
  Administered 2022-01-24: 0.5 mg via INTRAVENOUS

## 2022-01-24 SURGICAL SUPPLY — 46 items
APPLIER CLIP ROT 10 11.4 M/L (STAPLE) ×2
BAG COUNTER SPONGE SURGICOUNT (BAG) ×1 IMPLANT
BAG RETRIEVAL 10 (BASKET)
BENZOIN TINCTURE PRP APPL 2/3 (GAUZE/BANDAGES/DRESSINGS) ×2 IMPLANT
CHLORAPREP W/TINT 26 (MISCELLANEOUS) ×2 IMPLANT
CLIP APPLIE ROT 10 11.4 M/L (STAPLE) ×1 IMPLANT
COVER MAYO STAND XLG (MISCELLANEOUS) ×2 IMPLANT
COVER SURGICAL LIGHT HANDLE (MISCELLANEOUS) ×2 IMPLANT
DRAPE C-ARM 42X120 X-RAY (DRAPES) ×2 IMPLANT
DRSG IV TEGADERM 3.5X4.5 STRL (GAUZE/BANDAGES/DRESSINGS) ×3 IMPLANT
DRSG TEGADERM 2-3/8X2-3/4 SM (GAUZE/BANDAGES/DRESSINGS) ×6 IMPLANT
DRSG TEGADERM 4X4.75 (GAUZE/BANDAGES/DRESSINGS) ×2 IMPLANT
ELECT REM PT RETURN 15FT ADLT (MISCELLANEOUS) ×2 IMPLANT
GAUZE SPONGE 2X2 8PLY STRL LF (GAUZE/BANDAGES/DRESSINGS) IMPLANT
GLOVE BIO SURGEON STRL SZ7 (GLOVE) ×2 IMPLANT
GLOVE BIOGEL PI IND STRL 7.5 (GLOVE) ×1 IMPLANT
GLOVE BIOGEL PI INDICATOR 7.5 (GLOVE) ×1
GOWN STRL REUS W/ TWL LRG LVL3 (GOWN DISPOSABLE) ×1 IMPLANT
GOWN STRL REUS W/TWL LRG LVL3 (GOWN DISPOSABLE) ×1
IRRIG SUCT STRYKERFLOW 2 WTIP (MISCELLANEOUS) ×2
IRRIGATION SUCT STRKRFLW 2 WTP (MISCELLANEOUS) ×1 IMPLANT
KIT BASIN OR (CUSTOM PROCEDURE TRAY) ×2 IMPLANT
KIT TURNOVER KIT A (KITS) ×1 IMPLANT
NS IRRIG 1000ML POUR BTL (IV SOLUTION) ×2 IMPLANT
PENCIL SMOKE EVACUATOR (MISCELLANEOUS) ×1 IMPLANT
POUCH RETRIEVAL ECOSAC 10 (ENDOMECHANICALS) IMPLANT
POUCH RETRIEVAL ECOSAC 10MM (ENDOMECHANICALS) ×1
PROTECTOR NERVE ULNAR (MISCELLANEOUS) IMPLANT
SCISSORS LAP 5X35 DISP (ENDOMECHANICALS) ×2 IMPLANT
SET CHOLANGIOGRAPH MIX (MISCELLANEOUS) ×2 IMPLANT
SET TUBE SMOKE EVAC HIGH FLOW (TUBING) ×2 IMPLANT
SPIKE FLUID TRANSFER (MISCELLANEOUS) ×2 IMPLANT
SPONGE GAUZE 2X2 STER 10/PKG (GAUZE/BANDAGES/DRESSINGS) ×1
STRIP CLOSURE SKIN 1/2X4 (GAUZE/BANDAGES/DRESSINGS) ×2 IMPLANT
SUT MNCRL AB 4-0 PS2 18 (SUTURE) ×2 IMPLANT
SYR 20ML LL LF (SYRINGE) ×1 IMPLANT
SYS BAG RETRIEVAL 10MM (BASKET)
SYSTEM BAG RETRIEVAL 10MM (BASKET) IMPLANT
TAPE CLOTH 4X10 WHT NS (GAUZE/BANDAGES/DRESSINGS) IMPLANT
TOWEL OR 17X26 10 PK STRL BLUE (TOWEL DISPOSABLE) ×2 IMPLANT
TOWEL OR NON WOVEN STRL DISP B (DISPOSABLE) ×2 IMPLANT
TRAY LAPAROSCOPIC (CUSTOM PROCEDURE TRAY) ×2 IMPLANT
TROCAR BALLN 12MMX100 BLUNT (TROCAR) ×2 IMPLANT
TROCAR BLADELESS OPT 5 100 (ENDOMECHANICALS) ×4 IMPLANT
TROCAR XCEL NON-BLD 11X100MML (ENDOMECHANICALS) ×2 IMPLANT
WATER STERILE IRR 1000ML POUR (IV SOLUTION) ×2 IMPLANT

## 2022-01-24 NOTE — Transfer of Care (Signed)
Immediate Anesthesia Transfer of Care Note ? ?Patient: Richard Peters ? ?Procedure(s) Performed: LAPAROSCOPIC CHOLECYSTECTOMY WITH INTRAOPERATIVE CHOLANGIOGRAM ? ?Patient Location: PACU ? ?Anesthesia Type:General ? ?Level of Consciousness: drowsy ? ?Airway & Oxygen Therapy: Patient Spontanous Breathing and Patient connected to face mask oxygen ? ?Post-op Assessment: Report given to RN, Post -op Vital signs reviewed and stable and Patient moving all extremities X 4 ? ?Post vital signs: Reviewed and stable ? ?Last Vitals:  ?Vitals Value Taken Time  ?BP 204/104 01/24/22 1538  ?Temp    ?Pulse 63 01/24/22 1539  ?Resp 15 01/24/22 1539  ?SpO2 95 % 01/24/22 1539  ?Vitals shown include unvalidated device data. ? ?Last Pain:  ?Vitals:  ? 01/24/22 1250  ?TempSrc:   ?PainSc: 0-No pain  ?   ? ?Patients Stated Pain Goal: 3 (01/22/22 2102) ? ?Complications: No notable events documented. ?

## 2022-01-24 NOTE — Anesthesia Postprocedure Evaluation (Signed)
Anesthesia Post Note ? ?Patient: Richard Peters ? ?Procedure(s) Performed: LAPAROSCOPIC CHOLECYSTECTOMY WITH INTRAOPERATIVE CHOLANGIOGRAM ? ?  ? ?Patient location during evaluation: PACU ?Anesthesia Type: General ?Level of consciousness: awake and alert, patient cooperative and oriented ?Pain management: pain level controlled ?Vital Signs Assessment: post-procedure vital signs reviewed and stable ?Respiratory status: spontaneous breathing, nonlabored ventilation and respiratory function stable ?Cardiovascular status: blood pressure returned to baseline and stable ?Postop Assessment: no apparent nausea or vomiting ?Anesthetic complications: no ? ? ?No notable events documented. ? ?Last Vitals:  ?Vitals:  ? 01/24/22 1545 01/24/22 1600  ?BP: (!) 190/109 (!) 174/104  ?Pulse: 65 77  ?Resp: 15 14  ?Temp:    ?SpO2: 97% 92%  ?  ?Last Pain:  ?Vitals:  ? 01/24/22 1600  ?TempSrc:   ?PainSc: 6   ? ? ?  ?  ?  ?  ?  ?  ? ?Naava Janeway,E. [facility] ? ? ? ? ?

## 2022-01-24 NOTE — Progress Notes (Signed)
? ?Subjective/Chief Complaint: ?Still with some mild epigastric discomfort ?No nausea or vomiting ?T. Bili normal ? ? ?Objective: ?Vital signs in last 24 hours: ?Temp:  [98.2 ?F (36.8 ?C)-98.4 ?F (36.9 ?C)] 98.2 ?F (36.8 ?C) (05/15 0865) ?Pulse Rate:  [59-74] 59 (05/15 7846) ?Resp:  [18] 18 (05/15 9629) ?BP: (121-133)/(87-91) 128/89 (05/15 5284) ?SpO2:  [96 %-98 %] 98 % (05/15 1324) ?Last BM Date : 01/23/22 ? ?Intake/Output from previous day: ?05/14 0701 - 05/15 0700 ?In: 4010 [P.O.:120; I.V.:1178.8; IV Piggyback:150.3] ?Out: 0  ?Intake/Output this shift: ?No intake/output data recorded. ? ?General appearance: alert, cooperative, and no distress ?GI: soft, mildly tender in epigastrium; no peritonitis ? ?Lab Results:  ?Recent Labs  ?  01/23/22 ?0645 01/24/22 ?2725  ?WBC 3.8* 4.1  ?HGB 14.0 12.9*  ?HCT 43.1 38.6*  ?PLT 236 216  ? ?BMET ?Recent Labs  ?  01/23/22 ?0645 01/24/22 ?3664  ?NA 144 141  ?K 3.9 4.1  ?CL 111 107  ?CO2 27 27  ?GLUCOSE 76 99  ?BUN 9 11  ?CREATININE 1.10 1.20  ?CALCIUM 9.2 8.8*  ? ? ?  Latest Ref Rng & Units 01/24/2022  ?  4:17 AM 01/23/2022  ?  6:45 AM 01/22/2022  ?  5:21 AM  ?Hepatic Function  ?Total Protein 6.5 - 8.1 g/dL 6.4   7.1   6.7    ?Albumin 3.5 - 5.0 g/dL 3.6   3.9   3.8    ?AST 15 - 41 U/L 86   107   147    ?ALT 0 - 44 U/L 281   354   439    ?Alk Phosphatase 38 - 126 U/L 116   150   156    ?Total Bilirubin 0.3 - 1.2 mg/dL 0.9   1.6   1.5    ? ? ?Studies/Results: ?MR 3D Recon At Scanner ? ?Result Date: 01/22/2022 ?CLINICAL DATA:  Right upper quadrant pain EXAM: MRI ABDOMEN WITHOUT AND WITH CONTRAST (INCLUDING MRCP) TECHNIQUE: Multiplanar multisequence MR imaging of the abdomen was performed both before and after the administration of intravenous contrast. Heavily T2-weighted images of the biliary and pancreatic ducts were obtained, and three-dimensional MRCP images were rendered by post processing. CONTRAST:  76m GADAVIST GADOBUTROL 1 MMOL/ML IV SOLN COMPARISON:  CT abdomen 01/21/2022  FINDINGS: Study is significantly limited due to motion. Lower chest: Trace bilateral pleural effusions. Atelectatic changes at the left lung base. Elevated left hemidiaphragm. Hepatobiliary: Liver is normal in size and contour with no suspicious mass identified. No evidence of hepatic steatosis. 2 cm gallstone. Mild gallbladder wall thickening. Changes of focal adenomyomatosis at the fundus of the gallbladder noted. No significant biliary ductal dilatation identified. Common bile duct is upper normal caliber measuring 6.5 mm in diameter. No choledocholithiasis visualized given limitations. Pancreas: No mass, inflammatory changes, or other parenchymal abnormality identified. Spleen:  Within normal limits in size and appearance. Adrenals/Urinary Tract: Adrenal glands appear normal. There appear to be a few small renal cortical cysts bilaterally. No suspicious renal mass or hydronephrosis appreciated. Stomach/Bowel: Visualized portions within the abdomen are unremarkable. Vascular/Lymphatic: No pathologically enlarged lymph nodes identified. No abdominal aortic aneurysm demonstrated. Other:  No ascites. Musculoskeletal: No suspicious bone lesions identified. IMPRESSION: 1. Cholelithiasis with mild gallbladder wall thickening. Correlate clinically. 2. No definite choledocholithiasis or significant biliary ductal dilatation identified, given limitations of motion. 3. Trace bilateral pleural effusions. Atelectatic changes at the left lung base. Electronically Signed   By: DOfilia NeasM.D.   On: 01/22/2022 17:42  ? ?  MR ABDOMEN MRCP W WO CONTAST ? ?Result Date: 01/22/2022 ?CLINICAL DATA:  Right upper quadrant pain EXAM: MRI ABDOMEN WITHOUT AND WITH CONTRAST (INCLUDING MRCP) TECHNIQUE: Multiplanar multisequence MR imaging of the abdomen was performed both before and after the administration of intravenous contrast. Heavily T2-weighted images of the biliary and pancreatic ducts were obtained, and three-dimensional MRCP  images were rendered by post processing. CONTRAST:  31m GADAVIST GADOBUTROL 1 MMOL/ML IV SOLN COMPARISON:  CT abdomen 01/21/2022 FINDINGS: Study is significantly limited due to motion. Lower chest: Trace bilateral pleural effusions. Atelectatic changes at the left lung base. Elevated left hemidiaphragm. Hepatobiliary: Liver is normal in size and contour with no suspicious mass identified. No evidence of hepatic steatosis. 2 cm gallstone. Mild gallbladder wall thickening. Changes of focal adenomyomatosis at the fundus of the gallbladder noted. No significant biliary ductal dilatation identified. Common bile duct is upper normal caliber measuring 6.5 mm in diameter. No choledocholithiasis visualized given limitations. Pancreas: No mass, inflammatory changes, or other parenchymal abnormality identified. Spleen:  Within normal limits in size and appearance. Adrenals/Urinary Tract: Adrenal glands appear normal. There appear to be a few small renal cortical cysts bilaterally. No suspicious renal mass or hydronephrosis appreciated. Stomach/Bowel: Visualized portions within the abdomen are unremarkable. Vascular/Lymphatic: No pathologically enlarged lymph nodes identified. No abdominal aortic aneurysm demonstrated. Other:  No ascites. Musculoskeletal: No suspicious bone lesions identified. IMPRESSION: 1. Cholelithiasis with mild gallbladder wall thickening. Correlate clinically. 2. No definite choledocholithiasis or significant biliary ductal dilatation identified, given limitations of motion. 3. Trace bilateral pleural effusions. Atelectatic changes at the left lung base. Electronically Signed   By: DOfilia NeasM.D.   On: 01/22/2022 17:42   ? ?Anti-infectives: ?Anti-infectives (From admission, onward)  ? ? Start     Dose/Rate Route Frequency Ordered Stop  ? 01/21/22 2200  piperacillin-tazobactam (ZOSYN) IVPB 3.375 g       ? 3.375 g ?12.5 mL/hr over 240 Minutes Intravenous Every 8 hours 01/21/22 1446    ? 01/21/22  1430  piperacillin-tazobactam (ZOSYN) IVPB 3.375 g       ? 3.375 g ?100 mL/hr over 30 Minutes Intravenous  Once 01/21/22 1429 01/21/22 1544  ? ?  ? ? 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 ?Plan laparoscopic cholecystectomy with intraoperative cholangiogram today.  The surgical procedure has been discussed with the patient.  Potential risks, benefits, alternative treatments, and expected outcomes have been explained.  All of the patient's questions at this time have been answered.  The likelihood of reaching the patient's treatment goal is good.  The patient understand the proposed surgical procedure and wishes to proceed. ? ? LOS: 3 days  ? ? ?MImogene BurnTsuei ?01/24/2022 ? ?

## 2022-01-24 NOTE — Anesthesia Procedure Notes (Signed)
Procedure Name: Intubation ?Date/Time: 01/24/2022 2:22 PM ?Performed by: Niel Hummer, CRNA ?Pre-anesthesia Checklist: Patient identified, Emergency Drugs available, Suction available and Patient being monitored ?Patient Re-evaluated:Patient Re-evaluated prior to induction ?Oxygen Delivery Method: Circle system utilized ?Preoxygenation: Pre-oxygenation with 100% oxygen ?Induction Type: IV induction ?Ventilation: Mask ventilation without difficulty ?Laryngoscope Size: Mac and 4 ?Grade View: Grade I ?Tube type: Oral ?Tube size: 7.5 mm ?Number of attempts: 1 ?Airway Equipment and Method: Stylet ?Placement Confirmation: ETT inserted through vocal cords under direct vision, positive ETCO2 and breath sounds checked- equal and bilateral ?Secured at: 23 cm ?Tube secured with: Tape ?Dental Injury: Teeth and Oropharynx as per pre-operative assessment  ? ? ? ? ?

## 2022-01-24 NOTE — Anesthesia Preprocedure Evaluation (Addendum)
Anesthesia Evaluation  ?Patient identified by MRN, date of birth, ID band ?Patient awake ? ? ? ?Reviewed: ?Allergy & Precautions, NPO status , Patient's Chart, lab work & pertinent test results ? ?History of Anesthesia Complications ?Negative for: history of anesthetic complications ? ?Airway ?Mallampati: II ? ?TM Distance: >3 FB ?Neck ROM: Full ? ? ? Dental ? ?(+) Teeth Intact, Dental Advisory Given ?  ?Pulmonary ?neg pulmonary ROS,  ?  ?breath sounds clear to auscultation ? ? ? ? ? ? Cardiovascular ?negative cardio ROS ? ? ?Rhythm:Regular Rate:Normal ? ? ?  ?Neuro/Psych ?negative neurological ROS ?   ? GI/Hepatic ?GERD  Medicated and Controlled,Elevated LFTs with acute cholecystitis ?  ?Endo/Other  ?negative endocrine ROS ? Renal/GU ?negative Renal ROS  ? ?  ?Musculoskeletal ? ? Abdominal ?  ?Peds ? Hematology ?negative hematology ROS ?(+)   ?Anesthesia Other Findings ? ? Reproductive/Obstetrics ? ?  ? ? ? ? ? ? ? ? ? ? ? ? ? ?  ?  ? ? ? ? ? ? ? ?Anesthesia Physical ?Anesthesia Plan ? ?ASA: 2 ? ?Anesthesia Plan: General  ? ?Post-op Pain Management: Tylenol PO (pre-op)*  ? ?Induction: Intravenous ? ?PONV Risk Score and Plan: 2 and Ondansetron and Dexamethasone ? ?Airway Management Planned: Oral ETT ? ?Additional Equipment: None ? ?Intra-op Plan:  ? ?Post-operative Plan: Extubation in OR ? ?Informed Consent: I have reviewed the patients History and Physical, chart, labs and discussed the procedure including the risks, benefits and alternatives for the proposed anesthesia with the patient or authorized representative who has indicated his/her understanding and acceptance.  ? ? ? ?Dental advisory given ? ?Plan Discussed with: CRNA and Surgeon ? ?Anesthesia Plan Comments:   ? ? ? ? ? ?Anesthesia Quick Evaluation ? ?

## 2022-01-24 NOTE — Progress Notes (Signed)
?PROGRESS NOTE ? ? ? ?Richard Peters  ZOX:[id number] DOB: 04/20/1961 DOA: 01/21/2022 ?PCP: Ronnald NianLalonde, John C, MD  ? ? ? ?Brief Narrative:  ?61 y.o. BM PMHx GERD  ? ?Presented initially to the emergent care due to worsening upper abdominal pain associated with intermittent nausea and vomiting x1, 4 days ago, 1 episode of loose stool on the day of presentation.  Symptoms started less than a week ago at that time he had vomited x1 then his symptoms subsided.  They recurred on Tuesday 4 days ago, and gradually got worse.  The night prior to presentation, his abdominal pain was quite severe causing him to have minimal oral intake.  The next morning he presented to emergent care, blood work revealed abnormal LFTs, therefore was [facility] for further evaluation.  In the ED, CT scan abdomen and pelvis with contrast reveals dilated common bile duct with concern for choledocholithiasis.  EDP discussed case with GI who recommended MRCP and will see in consultation.  General surgery was also consulted by EDP.  TRH, hospitalist team, was asked to admit. ?  ?ED Course: Tmax 98.7.  BP 156/92, pulse 64, respiration rate 15, oxygen saturation 100% on room air.  Lab studies remarkable for alkaline phosphatase 180, lipase 63, AST 325, ALT 600, T. bili [name] 5.3.  WBC 3.5, hemoglobin 13.3, platelet 252.  CT abdomen pelvis with contrast showing mild gallbladder wall thickening, common bile duct measuring 10 mm in diameter ? ? ?Subjective: ?5/15 afebrile overnight liver enzymes trending down.  Patient and family waiting for surgical team to call him down to the OR. ? ? ?Assessment & Plan: ?Covid vaccination; ?  ?Principal Problem: ?  Abdominal pain ?Active Problems: ?  Cholelithiasis ?  Elevated liver enzymes ? ? ? Abdominal pain with concern for biliary obstruction, possible choledocholithiasis ?Presented with upper abdominal pain mostly epigastric region ?Elevated liver chemistries, T bilirubin 5.3, lipase minimally elevated 63,  repeat in the morning due to the location of his pain.  Normal appearance of the pancreas on CT scan. ?-Common bile duct dilatation 10 mm in diameter on CT scan ?-Pain management and bowel regimen in place ?-Continue antibiotics per GI/surgery ? ?Cholelithiasis ?- 5/13 MRCP positive cholelithiasis 2 cm gallstone: See study below ?-5/14 GI has signed off ?- 5/15 [facility]CS Plan is for cholecystectomy ?  ?Elevated liver enzyme ? Latest Reference Range & Units 01/21/22 11:09 01/22/22 05:21 01/23/22 06:45 01/24/22 04:17  ?AST 15 - 41 U/L 325 (H) 147 (H) 107 (H) 86 (H)  ?(H): Data is abnormally high ? Latest Reference Range & Units 01/21/22 11:09 01/22/22 05:21 01/23/22 06:45 01/24/22 04:17  ?ALT 0 - 44 U/L 600 (H) 439 (H) 354 (H) 281 (H)  ?(H): Data is abnormally high ?Trending down ? ?Elevated BP ?-Negative history HTN, most likely pain related ?- 5/14 resolved  ?  ?GERD ?- Protonix 40 mg daily  ? ? ?  ?Mobility Assessment (last 72 hours)   ? ? Mobility Assessment   ? ? Row Name 01/23/22 2120 01/23/22 0845 01/22/22 2102 01/22/22 0800 01/21/22 1952  ? Does patient have an order for bedrest or is patient medically unstable No - Continue assessment No - Continue assessment No - Continue assessment No - Continue assessment No - Continue assessment  ? What is the highest level of mobility based on the progressive mobility assessment? Level 6 (Walks independently in room and hall) - Balance while walking in room without assist - Complete Level 6 (Walks independently in  room and hall) - Balance while walking in room without assist - Complete Level 6 (Walks independently in room and hall) - Balance while walking in room without assist - Complete Level 6 (Walks independently in room and hall) - Balance while walking in room without assist - Complete Level 6 (Walks independently in room and hall) - Balance while walking in room without assist - Complete  ? ? Row Name 01/21/22 1948  ?  ?  ?  ?  ? Does patient have an order for bedrest  or is patient medically unstable No - Continue assessment      ? What is the highest level of mobility based on the progressive mobility assessment? Level 6 (Walks independently in room and hall) - Balance while walking in room without assist - Complete      ? ?  ?  ? ?  ? ? ? ?@IPAL @ ? ?   ?DVT prophylaxis: SCD ?Code Status: Full ?Family Communication: 5/15 wife and family at bedside for discussion of plan of care all questions answered ?Status is: Inpatient ? ? ? ?Dispo: The patient is from: Home ?             Anticipated d/c is to: Home ?             Anticipated d/c date is: 3 days ?             Patient currently is not medically stable to d/c. ? ? ? ? ? ?Consultants:  ?Dr. 6/15 GI ?Dr. Levora Angel, [facility] ? ? ?Procedures/Significant Events:  ?5/13 MRCP: ?- Cholelithiasis with mild gallbladder wall thickening.  ?-No definite choledocholithiasis or significant biliary ductal dilatation identified, given limitations of motion. ?-Trace bilateral pleural effusions. Atelectatic changes at the left lung base. ? ? ?I have personally reviewed and interpreted all radiology studies and my findings are as above. ? ?VENTILATOR SETTINGS: ? ? ? ?Cultures ?5/13 influenza A/B negative ?5/13 SARS coronavirus negative ? ? ? ?Antimicrobials: ?Anti-infectives (From admission, onward)  ? ? Start     Ordered Stop  ? 01/21/22 2200  piperacillin-tazobactam (ZOSYN) IVPB 3.375 g       ? 01/21/22 1446    ? 01/21/22 1430  piperacillin-tazobactam (ZOSYN) IVPB 3.375 g       ? 01/21/22 1429 01/21/22 1544  ? ?  ?  ? ? ?Devices ?  ? ?LINES / TUBES:  ? ? ? ? ?Continuous Infusions: ? sodium chloride 10 mL/hr at 01/21/22 1443  ? piperacillin-tazobactam (ZOSYN)  IV 3.375 g (01/24/22 0525)  ? ? ? ?Objective: ?Vitals:  ? 01/23/22 0530 01/23/22 1403 01/23/22 2155 01/24/22 01/26/22  ?BP: (!) 145/92 121/87 (!) 133/91 128/89  ?Pulse: (!) 59 74 63 (!) 59  ?Resp: 16 18 18 18   ?Temp: 98.2 ?F (36.8 ?C)  98.4 ?F (36.9 ?C) 98.2 ?F (36.8 ?C)  ?TempSrc: Oral  Oral  Oral  ?SpO2: 97% 96% 98% 98%  ?Weight:      ?Height:      ? ? ?Intake/Output Summary (Last 24 hours) at 01/24/2022 1118 ?Last data filed at 01/24/2022 1011 ?Gross per 24 hour  ?Intake 1449.04 ml  ?Output 0 ml  ?Net 1449.04 ml  ? ?Filed Weights  ? 01/21/22 1057  ?Weight: 99.8 kg  ? ? ?Examination: ? ?General: A/O x4, No acute respiratory distress ?Eyes: negative scleral hemorrhage, negative anisocoria, negative icterus ?ENT: Negative Runny nose, negative gingival bleeding, ?Neck:  Negative scars, masses, torticollis, lymphadenopathy, JVD ?Lungs: Clear to auscultation bilaterally  without wheezes or crackles ?Cardiovascular: Regular rate and rhythm without murmur gallop or rub normal S1 and S2 ?Abdomen: negative abdominal pain, nondistended, positive soft, bowel sounds, no rebound, no ascites, no appreciable mass ?Extremities: No significant cyanosis, clubbing, or edema bilateral lower extremities ?Skin: Negative rashes, lesions, ulcers ?Psychiatric:  Negative depression, negative anxiety, negative fatigue, negative mania  ?Central nervous system:  Cranial nerves II through XII intact, tongue/uvula midline, all extremities muscle strength 5/5, sensation intact throughout, negative dysarthria, negative expressive aphasia, negative receptive aphasia. ? ?.  ? ? ? ?Data Reviewed: Care during the described time interval was provided by me .  I have reviewed this patient's available data, including medical history, events of note, physical examination, and all test results as part of my evaluation. ? ?CBC: ?Recent Labs  ?Lab 01/21/22 ?1109 01/22/22 ?3086 01/23/22 ?0645 01/24/22 ?5784  ?WBC 3.5* 4.3 3.8* 4.1  ?NEUTROABS  --  2.7 1.8 1.9  ?HGB 13.3 12.8* 14.0 12.9*  ?HCT 40.0 38.6* 43.1 38.6*  ?MCV 82.5 84.6 85.0 84.5  ?PLT 252 201 236 216  ? ?Basic Metabolic Panel: ?Recent Labs  ?Lab 01/21/22 ?1109 01/22/22 ?6962 01/23/22 ?0645 01/24/22 ?9528  ?NA 138 139 144 141  ?K 3.8 3.7 3.9 4.1  ?CL 106 108 111 107  ?CO2 27 26 27 27    ?GLUCOSE 102* 78 76 99  ?BUN 9 8 9 11   ?CREATININE 1.02 1.12 1.10 1.20  ?CALCIUM 9.0 8.7* 9.2 8.8*  ?MG  --  2.0 2.0 2.0  ?PHOS  --  3.2 3.0 2.8  ? ?GFR: ?Estimated Creatinine Clearance: 80.1 mL/min (by C-G formu

## 2022-01-24 NOTE — Op Note (Addendum)
Laparoscopic Cholecystectomy with IOC Procedure Note ? ?Indications: This patient presents with symptomatic gallbladder disease and elevated bilirubin.  MRCP was negative.  His total bilirubin has returned to normal.  After discussion with the patient, he will undergo laparoscopic cholecystectomy. ? ?Pre-operative Diagnosis: Calculus of gallbladder with other cholecystitis, without mention of obstruction ? ?Post-operative Diagnosis: Calculus of gallbladder with acute cholecystitis, without mention of obstruction ? ?Surgeon: Wynona Luna  ? ?Assistants: None ? ?Anesthesia: General endotracheal anesthesia ? ?ASA Class: 2 ? ?Procedure Details  ?The patient was seen again in the Holding Room. The risks, benefits, complications, treatment options, and expected outcomes were discussed with the patient. The possibilities of reaction to medication, pulmonary aspiration, perforation of viscus, bleeding, recurrent infection, finding a normal gallbladder, the need for additional procedures, failure to diagnose a condition, the possible need to convert to an open procedure, and creating a complication requiring transfusion or operation were discussed with the patient. The likelihood of improving the patient's symptoms with return to their baseline status is good.  The patient and/or family concurred with the proposed plan, giving informed consent. The site of surgery properly noted. The patient was taken to Operating Room, identified as Ofilia Neas and the procedure verified as Laparoscopic Cholecystectomy with Intraoperative Cholangiogram. A Time Out was held and the above information confirmed. ? ?Prior to the induction of general anesthesia, antibiotic prophylaxis was administered. General endotracheal anesthesia was then administered and tolerated well. After the induction, the abdomen was prepped with Chloraprep and draped in the sterile fashion. The patient was positioned in the supine position. ? ?Local anesthetic  agent was injected into the skin below the umbilicus and an incision made. We dissected down to the abdominal fascia with blunt dissection.  The fascia was incised vertically and we entered the peritoneal cavity bluntly.  A pursestring suture of 0-Vicryl was placed around the fascial opening.  The Hasson cannula was inserted and secured with the stay suture.  Pneumoperitoneum was then created with CO2 and tolerated well without any adverse changes in the patient's vital signs. An 11-mm port was placed in the subxiphoid position.  Two 5-mm ports were placed in the right upper quadrant. All skin incisions were infiltrated with a local anesthetic agent before making the incision and placing the trocars.  ? ?We positioned the patient in reverse Trendelenburg, tilted slightly to the patient's left.  The gallbladder was identified, the fundus grasped and retracted cephalad. The gallbladder is chronically thickened, but no evidence of acute cholecystitis.  Adhesions were lysed bluntly and with the electrocautery where indicated, taking care not to injure any adjacent organs or viscus. The infundibulum was grasped and retracted laterally, exposing the peritoneum overlying the triangle of Calot. This was then divided and exposed in a blunt fashion. A critical view of the cystic duct and cystic artery was obtained.  The cystic duct was clearly identified and bluntly dissected circumferentially. The cystic duct was ligated with a clip distally.   An incision was made in the cystic duct and the [facility] cholangiogram catheter introduced. The catheter was secured using a clip. A cholangiogram was then obtained which showed good visualization of the distal and proximal biliary tree with no sign of filling defects or obstruction.  Contrast flowed easily into the duodenum. The catheter was then removed.  ? ?The cystic duct was then ligated with clips and divided. The cystic artery was identified, dissected free, ligated with clips and  divided as well.  ? ?The gallbladder was dissected from  the liver bed in retrograde fashion with the electrocautery. The gallbladder was removed and placed in an Eco sac. The liver bed was irrigated and inspected. Hemostasis was achieved with the electrocautery. Copious irrigation was utilized and was repeatedly aspirated until clear.  The gallbladder and Eco sac were then removed through the umbilical port site.  The pursestring suture was used to close the umbilical fascia.   ? ?We again inspected the right upper quadrant for hemostasis.  Pneumoperitoneum was released as we removed the trocars.  4-0 Monocryl was used to close the skin.   Benzoin, steri-strips, and clean dressings were applied. The patient was then extubated and brought to the recovery room in stable condition. Instrument, sponge, and needle counts were correct at closure and at the conclusion of the case.  ? ?Findings: ?Cholecystitis with Cholelithiasis ? ?Estimated Blood Loss: less than 50 mL ?        ?Drains: none ?        ?Specimens: Gallbladder     ?      ?Complications: None; patient tolerated the procedure well. ?        ?Disposition: PACU - hemodynamically stable. ?        ?Condition: stable ? ?Wilmon Arms. Kaliope Quinonez, MD, FACS ?[facility]  ?General Surgery ? ? ?01/24/2022 ?3:37 PM ? ? ? ?

## 2022-01-25 ENCOUNTER — Encounter (HOSPITAL_COMMUNITY): Payer: Self-pay | Admitting: Surgery

## 2022-01-25 DIAGNOSIS — K8000 Calculus of gallbladder with acute cholecystitis without obstruction: Secondary | ICD-10-CM

## 2022-01-25 DIAGNOSIS — R1013 Epigastric pain: Secondary | ICD-10-CM | POA: Diagnosis not present

## 2022-01-25 DIAGNOSIS — R748 Abnormal levels of other serum enzymes: Secondary | ICD-10-CM | POA: Diagnosis not present

## 2022-01-25 LAB — COMPREHENSIVE METABOLIC PANEL
ALT: 291 U/L — ABNORMAL HIGH (ref 0–44)
AST: 105 U/L — ABNORMAL HIGH (ref 15–41)
Albumin: 3.9 g/dL (ref 3.5–5.0)
Alkaline Phosphatase: 110 U/L (ref 38–126)
Anion gap: 7 (ref 5–15)
BUN: 10 mg/dL (ref 6–20)
CO2: 28 mmol/L (ref 22–32)
Calcium: 9.2 mg/dL (ref 8.9–10.3)
Chloride: 105 mmol/L (ref 98–111)
Creatinine, Ser: 1.01 mg/dL (ref 0.61–1.24)
GFR, Estimated: >60 mL/min (ref >60)
Glucose, Bld: 146 mg/dL — ABNORMAL HIGH (ref 70–99)
Potassium: 4.4 mmol/L (ref 3.5–5.1)
Sodium: 140 mmol/L (ref 135–145)
Total Bilirubin: 1 mg/dL (ref 0.3–1.2)
Total Protein: 7 g/dL (ref 6.5–8.1)

## 2022-01-25 LAB — CBC WITH DIFFERENTIAL/PLATELET
Abs Immature Granulocytes: 0.02 10*3/uL (ref 0.00–0.07)
Basophils Absolute: 0 10*3/uL (ref 0.0–0.1)
Basophils Relative: 0 %
Eosinophils Absolute: 0 10*3/uL (ref 0.0–0.5)
Eosinophils Relative: 0 %
HCT: 39.5 % (ref 39.0–52.0)
Hemoglobin: 12.9 g/dL — ABNORMAL LOW (ref 13.0–17.0)
Immature Granulocytes: 0 %
Lymphocytes Relative: 11 %
Lymphs Abs: 0.6 10*3/uL — ABNORMAL LOW (ref 0.7–4.0)
MCH: 28 pg (ref 26.0–34.0)
MCHC: 32.7 g/dL (ref 30.0–36.0)
MCV: 85.7 fL (ref 80.0–100.0)
Monocytes Absolute: 0.4 10*3/uL (ref 0.1–1.0)
Monocytes Relative: 7 %
Neutro Abs: 4.7 10*3/uL (ref 1.7–7.7)
Neutrophils Relative %: 82 %
Platelets: 246 10*3/uL (ref 150–400)
RBC: 4.61 MIL/uL (ref 4.22–5.81)
RDW: 15.4 % (ref 11.5–15.5)
WBC: 5.8 10*3/uL (ref 4.0–10.5)
nRBC: 0 % (ref 0.0–0.2)

## 2022-01-25 LAB — PHOSPHORUS: Phosphorus: 3.6 mg/dL (ref 2.5–4.6)

## 2022-01-25 LAB — MAGNESIUM: Magnesium: 1.9 mg/dL (ref 1.7–2.4)

## 2022-01-25 LAB — SURGICAL PATHOLOGY

## 2022-01-25 MED ORDER — MELATONIN 3 MG PO TABS: 3.0000 mg | ORAL_TABLET | Freq: Every evening | ORAL | 0 refills | Status: DC | PRN

## 2022-01-25 MED ORDER — IBUPROFEN 800 MG PO TABS: 400.0000 mg | ORAL_TABLET | Freq: Three times a day (TID) | ORAL | 0 refills | Status: DC | PRN

## 2022-01-25 MED ORDER — ACETAMINOPHEN 500 MG PO TABS: 1000.0000 mg | ORAL_TABLET | Freq: Four times a day (QID) | ORAL | 0 refills | Status: DC

## 2022-01-25 MED ORDER — OXYCODONE HCL 5 MG PO TABS: 5.0000 mg | ORAL_TABLET | Freq: Four times a day (QID) | ORAL | 0 refills | Status: DC | PRN

## 2022-01-25 MED ORDER — ONDANSETRON HCL 4 MG PO TABS: 4.0000 mg | ORAL_TABLET | Freq: Three times a day (TID) | ORAL | 0 refills | Status: DC | PRN

## 2022-01-25 NOTE — Discharge Instructions (Signed)
[facility] LAPAROSCOPIC SURGERY: POST OP INSTRUCTIONS Always review your discharge instruction sheet given to you by the facility where your surgery was performed. IF YOU HAVE DISABILITY OR FAMILY LEAVE FORMS, YOU MUST BRING THEM TO THE OFFICE FOR PROCESSING.   DO NOT GIVE THEM TO YOUR DOCTOR.  PAIN CONTROL  First take acetaminophen (Tylenol) AND/or ibuprofen (Advil) to control your pain after surgery.  Follow directions on package.  Taking acetaminophen (Tylenol) and/or ibuprofen (Advil) regularly after surgery will help to control your pain and lower the amount of prescription pain medication you may need.  You should not take more than 3,000 mg (3 grams) of acetaminophen (Tylenol) in 24 hours.  You should not take ibuprofen (Advil), aleve, motrin, naprosyn or other NSAIDS if you have a history of stomach ulcers or chronic kidney disease.  A prescription for pain medication may be given to you upon discharge.  Take your pain medication as prescribed, if you still have uncontrolled pain after taking acetaminophen (Tylenol) or ibuprofen (Advil). Use ice packs to help control pain. If you need a refill on your pain medication, please contact your pharmacy.  They will contact our office to request authorization. Prescriptions will not be filled after 5pm or on week-ends.  HOME MEDICATIONS Take your usually prescribed medications unless otherwise directed.  DIET You should follow a light diet the first few days after arrival home.  Be sure to include lots of fluids daily. Avoid fatty, fried foods.   CONSTIPATION It is common to experience some constipation after surgery and if you are taking pain medication.  Increasing fluid intake and taking a stool softener (such as Colace) will usually help or prevent this problem from occurring.  A mild laxative (Milk of Magnesia or Miralax) should be taken according to package instructions if there are no bowel movements after 48  hours.  WOUND/INCISION CARE Most patients will experience some swelling and bruising in the area of the incisions.  Ice packs will help.  Swelling and bruising can take several days to resolve.  Unless discharge instructions indicate otherwise, follow guidelines below  STERI-STRIPS - you may remove your outer bandages 48 hours after surgery, and you may shower at that time.  You have steri-strips (small skin tapes) in place directly over the incision.  These strips should be left on the skin for 7-10 days.   DERMABOND/SKIN GLUE - you may shower in 24 hours.  The glue will flake off over the next 2-3 weeks. Any sutures or staples will be removed at the office during your follow-up visit.  ACTIVITIES You may resume regular (light) daily activities beginning the next day--such as daily self-care, walking, climbing stairs--gradually increasing activities as tolerated.  You may have sexual intercourse when it is comfortable.  Refrain from any heavy lifting or straining until approved by your doctor. You may drive when you are no longer taking prescription pain medication, you can comfortably wear a seatbelt, and you can safely maneuver your car and apply brakes.  FOLLOW-UP You should see your doctor in the office for a follow-up appointment approximately 2-3 weeks after your surgery.  You should have been given your post-op/follow-up appointment when your surgery was scheduled.  If you did not receive a post-op/follow-up appointment, make sure that you call for this appointment within a day or two after you arrive home to insure a convenient appointment time.   WHEN TO CALL YOUR DOCTOR: Fever over 101.0 Inability to urinate Continued bleeding from incision.   Increased pain, redness, or drainage from the incision. Increasing abdominal pain  The clinic staff is available to answer your questions during regular business hours.  Please don't hesitate to call and ask to speak to one of the nurses for  clinical concerns.  If you have a medical emergency, go to the nearest emergency room or call 911.  A surgeon from [facility] is always on call at the hospital. [address], [address], [place], [place]  [address] ? [address] [address], [place], [place]   [address] [phone] ? [phone] ? FAX [phone] Web site: [email or web address]  

## 2022-01-25 NOTE — Progress Notes (Signed)
Reviewed written d/c instructions w pt and all questions answered. He verbalized understanding. D/ C via w/c w all belongings in stable condition. 

## 2022-01-25 NOTE — Progress Notes (Signed)
[facility] ?Progress Note ? ?1 Day Post-Op  ?Subjective: ?CC:  reports abdominal soreness, worse with movement, improves with PO pain meds. Denies nausea/vomiting. +flatus. Last BM was pre-operatively. Voiding without reported sxs. Wife at bedside. 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  ? ?Objective: ?Vital signs in last 24 hours: ?Temp:  [97.5 ?F (36.4 ?C)-98.7 ?F (37.1 ?C)] 98.5 ?F (36.9 ?C) (05/16 0916) ?Pulse Rate:  [60-93] 76 (05/16 0916) ?Resp:  [12-20] 16 (05/16 0916) ?BP: (122-197)/(70-109) 122/70 (05/16 0916) ?SpO2:  [92 %-100 %] 97 % (05/16 0916) ?Last BM Date : 01/23/22 ? ?Intake/Output from previous day: ?05/15 0701 - 05/16 0700 ?In: 1179.2 [P.O.:200; I.V.:883.7; IV Piggyback:95.5] ?Out: -  ?Intake/Output this shift: ?No intake/output data recorded. ? ?PE: ?Gen:  Alert, NAD, pleasant and cooperative ?Pulm:  Normal effort ORA ?Abd: Soft, appropriately tender, umbilical port site tegaderm saturated w/ sanguinous drainage - removed. Hemostatic with steris in place. Clean gauze dressing applied. Other 3 port sites c/d/I.  ?Skin: warm and dry, no rashes  ?Psych: A&Ox3  ? ?Lab Results:  ?Recent Labs  ?  01/24/22 ?0417 01/25/22 ?0446  ?WBC 4.1 5.8  ?HGB 12.9* 12.9*  ?HCT 38.6* 39.5  ?PLT 216 246  ? ?BMET ?Recent Labs  ?  01/24/22 ?0417 01/25/22 ?0446  ?NA 141 140  ?K 4.1 4.4  ?CL 107 105  ?CO2 27 28  ?GLUCOSE 99 146*  ?BUN 11 10  ?CREATININE 1.20 1.01  ?CALCIUM 8.8* 9.2  ? ?PT/INR ?No results for input(s): LABPROT, INR in the last 72 hours. ?CMP  ?   ?Component Value Date/Time  ? NA 140 01/25/2022 0446  ? NA 143 04/28/2021 1427  ? K 4.4 01/25/2022 0446  ? CL 105 01/25/2022 0446  ? CO2 28 01/25/2022 0446  ? GLUCOSE 146 (H) 01/25/2022 0446  ? BUN 10 01/25/2022 0446  ? BUN 14 04/28/2021 1427  ? CREATININE 1.01 01/25/2022 0446  ? CALCIUM 9.2 01/25/2022 0446  ? PROT 7.0 01/25/2022 0446  ? PROT 7.0 04/28/2021 1427  ? ALBUMIN 3.9 01/25/2022 0446  ? ALBUMIN 4.5 04/28/2021 1427  ? AST 105 (H) 01/25/2022 0446  ?  ALT 291 (H) 01/25/2022 0446  ? ALKPHOS 110 01/25/2022 0446  ? BILITOT 1.0 01/25/2022 0446  ? BILITOT 0.8 04/28/2021 1427  ? GFRNONAA >60 01/25/2022 0446  ? GFRAA >60 11/05/2016 0357  ? ?Lipase  ?   ?Component Value Date/Time  ? LIPASE 63 (H) 01/21/2022 1109  ? ? ? ? ? ?Studies/Results: ?DG Cholangiogram Operative ? ?Result Date: 01/24/2022 ?CLINICAL DATA:  60-year-old male with a history of cholelithiasis EXAM: INTRAOPERATIVE CHOLANGIOGRAM TECHNIQUE: Cholangiographic images from the C-arm fluoroscopic device were submitted for interpretation post-operatively. Please see the procedural report for the amount of contrast and the fluoroscopy time utilized. FLUOROSCOPY: Radiation Exposure Index (as provided by the fluoroscopic device): 8.7 mGy Kerma COMPARISON:  MR 01/22/2022 FINDINGS: Surgical instruments project over the upper abdomen. There is cannulation of the cystic duct/gallbladder neck, with antegrade infusion of contrast. Caliber of the extrahepatic ductal system within normal limits. No definite filling defect within the extrahepatic ducts identified. Free flow of contrast across the ampulla. IMPRESSION: Intraoperative cholangiogram demonstrates extrahepatic biliary ducts of unremarkable caliber, with no definite filling defects identified. Free flow of contrast across the ampulla. Please refer to the dictated operative report for full details of intraoperative findings and procedure Electronically Signed   By: Jaime  Wagner D.O.   On: 01/24/2022 15:20   ? ?Anti-infectives: ?Anti-infectives (From admission, onward)  ? ?   Start     Dose/Rate Route Frequency Ordered Stop  ? 01/21/22 2200  piperacillin-tazobactam (ZOSYN) IVPB 3.375 g  Status:  Discontinued       ? 3.375 g ?12.5 mL/hr over 240 Minutes Intravenous Every 8 hours 01/21/22 1446 01/24/22 1704  ? 01/21/22 1430  piperacillin-tazobactam (ZOSYN) IVPB 3.375 g       ? 3.375 g ?100 mL/hr over 30 Minutes Intravenous  Once 01/21/22 1429 01/21/22 1544  ? ?   ? ? ? ?Assessment/Plan ? 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 Georgette Dover  ?- AFVSS, AST/ALT slightly elevated - not unexpected after intra-op cautery of liver. Alk phos and bili are normal.  ?- clinically improving with appropriate post-op soreness. Stable for discharge. Follow up, work note, and pain Rx provided.  ? ? LOS: 4 days  ? ?I reviewed nursing notes, last 24 h vitals and pain scores, last 48 h intake and output, last 24 h labs and trends, and last 24 h imaging results. ? ? ?Obie Dredge, PA-C ?[facility] ?Please see Amion for pager number during day hours 7:00am-4:30pm ? ? ? ? ? ? ?

## 2022-01-25 NOTE — Discharge Summary (Signed)
Physician Discharge Summary  ?Richard Peters IRC:[id number] DOB: Dec 21, 1960 DOA: 01/21/2022 ? ?PCP: Ronnald Nian, MD ? ?Admit date: 01/21/2022 ?Discharge date: 01/25/2022 ? ?Time spent: 35 minutes ? ?Recommendations for Outpatient Follow-up:  ? ?Abdominal pain with concern for biliary obstruction, possible choledocholithiasis ?Presented with upper abdominal pain mostly epigastric region ?Elevated liver chemistries, T bilirubin 5.3, lipase minimally elevated 63, repeat in the morning due to the location of his pain.  Normal appearance of the pancreas on CT scan. ?-Common bile duct dilatation 10 mm in diameter on CT scan ?-Pain management and bowel regimen in place ?-Continue antibiotics per GI/surgery ?  ?Cholelithiasis ?- 5/13 MRCP positive cholelithiasis 2 cm gallstone: See study below ?-5/14 GI has signed off ?- 5/16 s/p cholecystectomy ?-Follow-up with [facility].  Per their instructions call in for follow-up appointment [facility]  ? ?Elevated BP ?-Negative history HTN, most likely pain related ?- 5/14 resolved  ?  ?GERD ?- Protonix 40 mg daily  ? ?Discharge Diagnoses:  ?Principal Problem: ?  Abdominal pain ?Active Problems: ?  Cholelithiasis ?  Elevated liver enzymes ? ? ?Discharge Condition: Stable ? ?Diet recommendation: Parke Simmers ? ?Filed Weights  ? 01/21/22 1057  ?Weight: 99.8 kg  ? ? ?History of present illness:  ?61 y.o. BM PMHx GERD  ?  ?Presented initially to the emergent care due to worsening upper abdominal pain associated with intermittent nausea and vomiting x1, 4 days ago, 1 episode of loose stool on the day of presentation.  Symptoms started less than a week ago at that time he had vomited x1 then his symptoms subsided.  They recurred on Tuesday 4 days ago, and gradually got worse.  The night prior to presentation, his abdominal pain was quite severe causing him to have minimal oral intake.  The next morning he presented to emergent care, blood work revealed abnormal LFTs, therefore was sent to [facility] ED for  further evaluation.  In the ED, CT scan abdomen and pelvis with contrast reveals dilated common bile duct with concern for choledocholithiasis.  EDP discussed case with GI who recommended MRCP and will see in consultation.  General surgery was also consulted by EDP.  TR[facility], hospitalist team, was asked to admit. ?  ?ED Course: Tmax 98.7.  BP 156/92, pulse 64, respiration rate 15, oxygen saturation 100% on room air.  Lab studies remarkable for alkaline phosphatase 180, lipase 63, AST 325, ALT 600, T. bili [name] 5.3.  WBC 3.5, hemoglobin 13.3, platelet 252.  CT abdomen pelvis with contrast showing mild gallbladder wall thickening, common bile duct measuring 10 mm in diameter ?  ? ?Hospital Course:  ?See above ? ?Procedures: ?5/13 MRCP: ?- Cholelithiasis with mild gallbladder wall thickening.  ?-No definite choledocholithiasis or significant biliary ductal dilatation identified, given limitations of motion. ?-Trace bilateral pleural effusions. Atelectatic changes at the left lung base.  ?5/15 laparoscopic cholecystectomy: Findings Cholecystitis with Cholelithiasis ? ?Consultations: ?Dr. Levora Angel GI ?Dr. Chevis Pretty, [facility] ?[facility] Dr. Manus Rudd ? ?Cultures ?5/13 influenza A/B negative ?5/13 SARS coronavirus negative ?  ?  ?  ?Antimicrobials: ?Anti-infectives (From admission, onward)  ? ? Start     Ordered Stop  ? 01/21/22 2200  piperacillin-tazobactam (ZOSYN) IVPB 3.375 g  Status:  Discontinued       ? 01/21/22 1446 01/24/22 1704  ? 01/21/22 1430  piperacillin-tazobactam (ZOSYN) IVPB 3.375 g       ? 01/21/22 1429 01/21/22 1544  ? ?  ?  ? ? ?Discharge Exam: ?Vitals:  ? 01/24/22 1918  01/25/22 0107 01/25/22 0526 01/25/22 0916  ?BP: (!) 150/88 (!) 151/101 (!) 161/88 122/70  ?Pulse: 80 75 63 76  ?Resp: 20 18 20 16   ?Temp: (!) 97.5 ?F (36.4 ?C) (!) 97.5 ?F (36.4 ?C) 97.8 ?F (36.6 ?C) 98.5 ?F (36.9 ?C)  ?TempSrc: Oral Oral Oral   ?SpO2: 92% 95% 97% 97%  ?Weight:      ?Height:      ? ? ?General: A/O x4, No acute respiratory  distress ?Eyes: negative scleral hemorrhage, negative anisocoria, negative icterus ?ENT: Negative Runny nose, negative gingival bleeding, ?Neck:  Negative scars, masses, torticollis, lymphadenopathy, JVD ?Lungs: Clear to auscultation bilaterally without wheezes or crackles ?Cardiovascular: Regular rate and rhythm without murmur gallop or rub normal S1 and S2 ? ?Discharge Instructions ? ? ?Allergies as of 01/25/2022   ?No Known Allergies ?  ? ?  ?Medication List  ?  ? ?STOP taking these medications   ? ?omeprazole 20 MG tablet ?Commonly known as: PRILOSEC OTC ?  ?sucralfate 1 g tablet ?Commonly known as: Carafate ?  ? ?  ? ?TAKE these medications   ? ?acetaminophen 500 MG tablet ?Commonly known as: TYLENOL ?Take 2 tablets (1,000 mg total) by mouth every 6 (six) hours. ?What changed:  ?how much to take ?when to take this ?reasons to take this ?  ?ibuprofen 800 MG tablet ?Commonly known as: ADVIL ?Take 0.5 tablets (400 mg total) by mouth every 8 (eight) hours as needed. ?  ?melatonin 3 MG Tabs tablet ?Take 1 tablet (3 mg total) by mouth at bedtime as needed. ?  ?multivitamin with minerals Tabs tablet ?Take 1 tablet by mouth daily. ?  ?omeprazole 40 MG capsule ?Commonly known as: PRILOSEC ?Take 1 capsule daily at least 30 minutes before first dose of Carafate. ?  ?ondansetron 4 MG tablet ?Commonly known as: Zofran ?Take 1 tablet (4 mg total) by mouth every 8 (eight) hours as needed for nausea or vomiting. ?  ?oxyCODONE 5 MG immediate release tablet ?Commonly known as: Oxy IR/ROXICODONE ?Take 1 tablet (5 mg total) by mouth every 6 (six) hours as needed for moderate pain or breakthrough pain (pain not relieved by tylenol or advil). ?  ? ?  ? ?No Known Allergies ? Follow-up Information   ? ? Maczis, Hedda SladePuja Gosai, PA-C Follow up.   ?Specialty: General Surgery ?Why: our office is scheduling you for post-operative follow up. Please call to confirm appointment date/time. please arrive 20-30 minutes early to your  appointment. ?Contact information: ?[address] ?[address] ?[place] [id number] ?[phone] ? ? ?  ?  ? ?  ?  ? ?  ? ? ? ?The results of significant diagnostics from this hospitalization (including imaging, microbiology, ancillary and laboratory) are listed below for reference.   ? ?Significant Diagnostic Studies: ?DG Cholangiogram Operative ? ?Result Date: 01/24/2022 ?CLINICAL DATA:  61 year old male with a history of cholelithiasis EXAM: INTRAOPERATIVE CHOLANGIOGRAM TECHNIQUE: Cholangiographic images from the C-arm fluoroscopic device were submitted for interpretation post-operatively. Please see the procedural report for the amount of contrast and the fluoroscopy time utilized. FLUOROSCOPY: Radiation Exposure Index (as provided by the fluoroscopic device): 8.7 mGy Kerma COMPARISON:  MR 01/22/2022 FINDINGS: Surgical instruments project over the upper abdomen. There is cannulation of the cystic duct/gallbladder neck, with antegrade infusion of contrast. Caliber of the extrahepatic ductal system within normal limits. No definite filling defect within the extrahepatic ducts identified. Free flow of contrast across the ampulla. IMPRESSION: Intraoperative cholangiogram demonstrates extrahepatic biliary ducts of unremarkable caliber,  with no definite filling defects identified. Free flow of contrast across the ampulla. Please refer to the dictated operative report for full details of intraoperative findings and procedure Electronically Signed   By: Gilmer Mor D.O.   On: 01/24/2022 15:20  ? ?CT ABDOMEN PELVIS W CONTRAST ? ?Result Date: 01/21/2022 ?CLINICAL DATA:  Epigastric pain which began overnight, similar symptoms 2 weeks ago EXAM: CT ABDOMEN AND PELVIS WITH CONTRAST TECHNIQUE: Multidetector CT imaging of the abdomen and pelvis was performed using the standard protocol following bolus administration of intravenous contrast. RADIATION DOSE REDUCTION: This exam was performed according to the departmental  dose-optimization program which includes automated exposure control, adjustment of the mA and/or kV according to patient size and/or use of iterative reconstruction technique. CONTRAST:  OMNIPAQUE IOHEXOL 300 MG/ML SOLN IV. N

## 2022-02-11 DIAGNOSIS — R1011 Right upper quadrant pain: Secondary | ICD-10-CM | POA: Diagnosis not present

## 2022-05-02 ENCOUNTER — Other Ambulatory Visit (HOSPITAL_COMMUNITY)
Admission: RE | Admit: 2022-05-02 | Discharge: 2022-05-02 | Disposition: A | Discharge status: Home / Self Care | Payer: BC Managed Care – PPO | Source: Ambulatory Visit | Attending: Family Medicine | Admitting: Family Medicine

## 2022-05-02 ENCOUNTER — Encounter: Payer: Self-pay | Admitting: Family Medicine

## 2022-05-02 ENCOUNTER — Ambulatory Visit (INDEPENDENT_AMBULATORY_CARE_PROVIDER_SITE_OTHER): Payer: BC Managed Care – PPO | Admitting: Family Medicine

## 2022-05-02 VITALS — BP 132/80 | HR 80 | Temp 98.6°F | Ht 72.0 in | Wt 212.4 lb

## 2022-05-02 DIAGNOSIS — R748 Abnormal levels of other serum enzymes: Secondary | ICD-10-CM

## 2022-05-02 DIAGNOSIS — Z0000 Encounter for general adult medical examination without abnormal findings: Secondary | ICD-10-CM | POA: Diagnosis not present

## 2022-05-02 DIAGNOSIS — L918 Other hypertrophic disorders of the skin: Secondary | ICD-10-CM | POA: Insufficient documentation

## 2022-05-02 DIAGNOSIS — Z23 Encounter for immunization: Secondary | ICD-10-CM

## 2022-05-02 DIAGNOSIS — Z8719 Personal history of other diseases of the digestive system: Secondary | ICD-10-CM

## 2022-05-02 DIAGNOSIS — R1013 Epigastric pain: Secondary | ICD-10-CM

## 2022-05-02 DIAGNOSIS — Z9889 Other specified postprocedural states: Secondary | ICD-10-CM

## 2022-05-02 DIAGNOSIS — Z1211 Encounter for screening for malignant neoplasm of colon: Secondary | ICD-10-CM

## 2022-05-02 DIAGNOSIS — Z9049 Acquired absence of other specified parts of digestive tract: Secondary | ICD-10-CM

## 2022-05-02 DIAGNOSIS — K8000 Calculus of gallbladder with acute cholecystitis without obstruction: Secondary | ICD-10-CM

## 2022-05-02 NOTE — Progress Notes (Signed)
Complete physical exam  Patient: Richard Peters   DOB: 03/18/1961   60 y.o. Male  MRN: [id number]  Subjective:    Chief Complaint  Patient presents with   Annual Exam    CPE fasting labs had hernia surgery last Nov. Still has some pain from that and some knee pain from working.     Richard Peters is a 61 y.o. male who presents today for a complete physical exam. He reports consuming a general diet. The patient does not participate in regular exercise at present. He generally feels fairly well. He reports sleeping well.  He did have bilateral hernia repair and did fairly well but now states that since his surgery he has continued to have bilateral inguinal pain.  He also had a hydrocelectomy and thinks that this might be returning as well.  He also has had a laparoscopic cholecystectomy and did have elevated liver enzymes after that. He does have a skin tag present on the right axilla.  He does not smoke or drink.  He states that the lower abdominal pain is interfering with sexual activity.  Otherwise his family and social history as well as health maintenance and immunizations was reviewed.  Most recent fall risk assessment:    05/02/2022    1:40 PM  Fall Risk   Falls in the past year? 0  Number falls in past yr: 0  Injury with Fall? 0  Risk for fall due to : No Fall Risks  Follow up Falls evaluation completed     Most recent depression screenings:    05/02/2022    1:42 PM 04/28/2021    1:30 PM  PHQ 2/9 Scores  PHQ - 2 Score 0 0        Patient Care Team: Ronnald Nian, MD as PCP - General (Family Medicine)   Outpatient Medications Prior to Visit  Medication Sig   Multiple Vitamin (MULTIVITAMIN WITH MINERALS) TABS tablet Take 1 tablet by mouth daily.   acetaminophen (TYLENOL) 500 MG tablet Take 2 tablets (1,000 mg total) by mouth every 6 (six) hours.   ibuprofen (ADVIL) 800 MG tablet Take 0.5 tablets (400 mg total) by mouth every 8 (eight) hours as needed.   melatonin 3  MG TABS tablet Take 1 tablet (3 mg total) by mouth at bedtime as needed.   omeprazole (PRILOSEC) 40 MG capsule Take 1 capsule daily at least 30 minutes before first dose of Carafate. (Patient not taking: Reported on 04/28/2021)   ondansetron (ZOFRAN) 4 MG tablet Take 1 tablet (4 mg total) by mouth every 8 (eight) hours as needed for nausea or vomiting.   [DISCONTINUED] oxyCODONE (OXY IR/ROXICODONE) 5 MG immediate release tablet Take 1 tablet (5 mg total) by mouth every 6 (six) hours as needed for moderate pain or breakthrough pain (pain not relieved by tylenol or advil).   No facility-administered medications prior to visit.    Review of Systems  All other systems reviewed and are negative.         Objective:     BP 132/80   Pulse 80   Temp 98.6 F (37 C)   Ht 6' (1.829 m)   Wt 212 lb 6.4 oz (96.3 kg)   BMI 28.81 kg/m    Physical Exam  Alert and in no distress. Tympanic membranes and canals are normal. Pharyngeal area is normal. Neck is supple without adenopathy or thyromegaly. Cardiac exam shows a regular sinus rhythm without murmurs or gallops. Lungs are clear to auscultation.  Abdominal exam shows no masses or tenderness.  Rather large skin tag that is pedunculated is in the right axillary area.      Assessment & Plan:    Routine general medical examination at a health care facility - Plan: CBC with Differential/Platelet, Comprehensive metabolic panel, Lipid panel  Elevated liver enzymes - Plan: Comprehensive metabolic panel  History of hydrocelectomy  S/P laparoscopic cholecystectomy  S/P bilateral inguinal herniorrhaphy - Plan: Ambulatory referral to General Surgery  Skin tag  Need for influenza vaccination - Plan: Flu Vaccine QUAD 6+ mos PF IM (Fluarix Quad PF)  Need for shingles vaccine - Plan: Varicella-zoster vaccine IM  Screening for colon cancer - Plan: Cologuard The skin tag was cleaned with alcohol, excised and the base hyfrecated with silver nitrate  without difficulty. Immunization History  Administered Date(s) Administered   PFIZER(Purple Top)SARS-COV-2 Vaccination 11/25/2019, 12/17/2019, 08/02/2020   Tdap 03/12/2002, 04/28/2021    Health Maintenance  Topic Date Due   COLONOSCOPY (Pts 45-35yrs Insurance coverage will need to be confirmed)  Never done   Zoster Vaccines- Shingrix (1 of 2) Never done   COVID-19 Vaccine (4 - Pfizer series) 09/27/2020   INFLUENZA VACCINE  04/12/2022   TETANUS/TDAP  04/29/2031   Hepatitis C Screening  Completed   HIV Screening  Completed   HPV VACCINES  Aged Out    Discussed health benefits of physical activity, and encouraged him to engage in regular exercise appropriate for his age and condition.  Problem List Items Addressed This Visit     Elevated liver enzymes   Relevant Orders   Comprehensive metabolic panel   History of hydrocelectomy   S/P laparoscopic cholecystectomy   Other Visit Diagnoses     Routine general medical examination at a health care facility    -  Primary   Relevant Orders   CBC with Differential/Platelet   Comprehensive metabolic panel   Lipid panel   S/P bilateral inguinal herniorrhaphy       Relevant Orders   Ambulatory referral to General Surgery   Skin tag       Need for influenza vaccination       Relevant Orders   Flu Vaccine QUAD 6+ mos PF IM (Fluarix Quad PF)   Need for shingles vaccine       Relevant Orders   Varicella-zoster vaccine IM   Screening for colon cancer       Relevant Orders   Cologuard      Complete exam in 1 year    Sharlot Gowda, MD

## 2022-05-02 NOTE — Addendum Note (Signed)
Addended by: Luciana Axe, Catarino Vold D on: 05/02/2022 04:15 PM   Modules accepted: Orders

## 2022-05-03 LAB — CBC WITH DIFFERENTIAL/PLATELET
Basophils Absolute: 0 10*3/uL (ref 0.0–0.2)
Basos: 1 %
EOS (ABSOLUTE): 0.1 10*3/uL (ref 0.0–0.4)
Eos: 3 %
Hematocrit: 38.5 % (ref 37.5–51.0)
Hemoglobin: 12.8 g/dL — ABNORMAL LOW (ref 13.0–17.7)
Immature Grans (Abs): 0 10*3/uL (ref 0.0–0.1)
Immature Granulocytes: 1 %
Lymphocytes Absolute: 1.5 10*3/uL (ref 0.7–3.1)
Lymphs: 34 %
MCH: 27.4 pg (ref 26.6–33.0)
MCHC: 33.2 g/dL (ref 31.5–35.7)
MCV: 82 fL (ref 79–97)
Monocytes Absolute: 0.4 10*3/uL (ref 0.1–0.9)
Monocytes: 8 %
Neutrophils Absolute: 2.3 10*3/uL (ref 1.4–7.0)
Neutrophils: 53 %
Platelets: 227 10*3/uL (ref 150–450)
RBC: 4.67 x10E6/uL (ref 4.14–5.80)
RDW: 13.8 % (ref 11.6–15.4)
WBC: 4.4 10*3/uL (ref 3.4–10.8)

## 2022-05-03 LAB — COMPREHENSIVE METABOLIC PANEL
ALT: 15 IU/L (ref 0–44)
AST: 19 IU/L (ref 0–40)
Albumin/Globulin Ratio: 2 (ref 1.2–2.2)
Albumin: 4.4 g/dL (ref 3.8–4.9)
Alkaline Phosphatase: 61 IU/L (ref 44–121)
BUN/Creatinine Ratio: 8 — ABNORMAL LOW (ref 10–24)
BUN: 9 mg/dL (ref 8–27)
Bilirubin Total: 0.5 mg/dL (ref 0.0–1.2)
CO2: 24 mmol/L (ref 20–29)
Calcium: 9.3 mg/dL (ref 8.6–10.2)
Chloride: 109 mmol/L — ABNORMAL HIGH (ref 96–106)
Creatinine, Ser: 1.14 mg/dL (ref 0.76–1.27)
Globulin, Total: 2.2 g/dL (ref 1.5–4.5)
Glucose: 100 mg/dL — ABNORMAL HIGH (ref 70–99)
Potassium: 4.2 mmol/L (ref 3.5–5.2)
Sodium: 148 mmol/L — ABNORMAL HIGH (ref 134–144)
Total Protein: 6.6 g/dL (ref 6.0–8.5)
eGFR: 74 mL/min/{1.73_m2} (ref >59)

## 2022-05-03 LAB — LIPID PANEL
Chol/HDL Ratio: 3.2 ratio (ref 0.0–5.0)
Cholesterol, Total: 125 mg/dL (ref 100–199)
HDL: 39 mg/dL — ABNORMAL LOW (ref >39)
LDL Chol Calc (NIH): 57 mg/dL (ref 0–99)
Triglycerides: 172 mg/dL — ABNORMAL HIGH (ref 0–149)
VLDL Cholesterol Cal: 29 mg/dL (ref 5–40)

## 2022-05-04 LAB — SURGICAL PATHOLOGY

## 2022-06-04 DIAGNOSIS — Z1211 Encounter for screening for malignant neoplasm of colon: Secondary | ICD-10-CM | POA: Diagnosis not present

## 2022-06-07 DIAGNOSIS — N433 Hydrocele, unspecified: Secondary | ICD-10-CM | POA: Diagnosis not present

## 2022-06-09 LAB — COLOGUARD: COLOGUARD: NEGATIVE

## 2022-06-21 ENCOUNTER — Encounter: Payer: Self-pay | Admitting: Internal Medicine

## 2022-07-04 ENCOUNTER — Encounter: Payer: Self-pay | Admitting: Internal Medicine

## 2023-05-29 ENCOUNTER — Encounter: Payer: BC Managed Care – PPO | Admitting: Family Medicine

## 2023-05-31 ENCOUNTER — Encounter: Payer: Self-pay | Admitting: Family Medicine

## 2023-05-31 ENCOUNTER — Ambulatory Visit (INDEPENDENT_AMBULATORY_CARE_PROVIDER_SITE_OTHER): Payer: BC Managed Care – PPO | Admitting: Family Medicine

## 2023-05-31 VITALS — BP 118/76 | HR 65 | Ht 72.0 in | Wt 212.0 lb

## 2023-05-31 DIAGNOSIS — Z23 Encounter for immunization: Secondary | ICD-10-CM

## 2023-05-31 DIAGNOSIS — Z0000 Encounter for general adult medical examination without abnormal findings: Secondary | ICD-10-CM | POA: Diagnosis not present

## 2023-05-31 DIAGNOSIS — Z1211 Encounter for screening for malignant neoplasm of colon: Secondary | ICD-10-CM

## 2023-05-31 DIAGNOSIS — R748 Abnormal levels of other serum enzymes: Secondary | ICD-10-CM

## 2023-05-31 NOTE — Progress Notes (Signed)
Complete physical exam  Patient: Richard Peters   DOB: 1960/10/02   62 y.o. Male  MRN: [id number]  Subjective:     Richard Peters is a 62 y.o. male who presents today for a complete physical exam. He reports consuming a general diet. The patient does not participate in regular exercise at present. He generally feels fairly well. He reports sleeping well. He does not have additional problems to discuss today.  He does not smoke or drink.  His job keeps him fairly physically active.  He did have a Cologuard done last year.  He takes over-the-counter medications.   Most recent fall risk assessment:    05/31/2023    3:02 PM  Fall Risk   Falls in the past year? 0  Number falls in past yr: 0  Injury with Fall? 0  Follow up Falls evaluation completed     Most recent depression screenings:    05/31/2023    3:02 PM 05/02/2022    1:42 PM  PHQ 2/9 Scores  PHQ - 2 Score 0 0    Vision:Within last year and Dental: Receives regular dental care    Patient Care Team: Ronnald Nian, MD as PCP - General (Family Medicine)   Outpatient Medications Prior to Visit  Medication Sig   Multiple Vitamin (MULTIVITAMIN WITH MINERALS) TABS tablet Take 1 tablet by mouth daily.   acetaminophen (TYLENOL) 500 MG tablet Take 2 tablets (1,000 mg total) by mouth every 6 (six) hours.   ibuprofen (ADVIL) 800 MG tablet Take 0.5 tablets (400 mg total) by mouth every 8 (eight) hours as needed.   melatonin 3 MG TABS tablet Take 1 tablet (3 mg total) by mouth at bedtime as needed.   omeprazole (PRILOSEC) 40 MG capsule Take 1 capsule daily at least 30 minutes before first dose of Carafate. (Patient not taking: Reported on 04/28/2021)   ondansetron (ZOFRAN) 4 MG tablet Take 1 tablet (4 mg total) by mouth every 8 (eight) hours as needed for nausea or vomiting.   No facility-administered medications prior to visit.    Review of Systems  All other systems reviewed and are negative.  Family and social history as  well as health maintenance and immunizations was reviewed       Objective:       Physical Exam  Alert and in no distress. Tympanic membranes and canals are normal. Pharyngeal area is normal. Neck is supple without adenopathy or thyromegaly. Cardiac exam shows a regular sinus rhythm without murmurs or gallops. Lungs are clear to auscultation.  Abdominal exam shows no masses or tenderness.      Assessment & Plan:    Routine general medical examination at a health care facility - Plan: CBC with Differential/Platelet, Comprehensive metabolic panel, Lipid panel  Need for shingles vaccine - Plan: Zoster Recombinant (Shingrix ), CANCELED: Varicella-zoster vaccine IM  Need for influenza vaccination - Plan: Flu vaccine trivalent PF, 6mos and older(Flulaval,Afluria,Fluarix,Fluzone)  Immunization History  Administered Date(s) Administered   Influenza,inj,Quad PF,6+ Mos 05/02/2022   PFIZER(Purple Top)SARS-COV-2 Vaccination 11/25/2019, 12/17/2019, 08/02/2020   Tdap 03/12/2002, 04/28/2021   Zoster Recombinant(Shingrix) 05/02/2022    Health Maintenance  Topic Date Due   Colonoscopy  Never done   Zoster Vaccines- Shingrix (2 of 2) 06/27/2022   INFLUENZA VACCINE  04/13/2023   COVID-19 Vaccine (4 - 2023-24 season) 05/14/2023   DTaP/Tdap/Td (3 - Td or Tdap) 04/29/2031   Hepatitis C Screening  Completed   HIV Screening  Completed   HPV VACCINES  Aged Out    Discussed health benefits of physical activity, and encouraged him to engage in regular exercise appropriate for his age and condition.  Problem List Items Addressed This Visit   None Visit Diagnoses     Routine general medical examination at a health care facility    -  Primary   Relevant Orders   CBC with Differential/Platelet   Comprehensive metabolic panel   Lipid panel   Need for shingles vaccine       Relevant Orders   Zoster Recombinant (Shingrix )   Need for influenza vaccination       Relevant Orders   Flu vaccine  trivalent PF, 6mos and older(Flulaval,Afluria,Fluarix,Fluzone)      Recheck 1 year    Sharlot Gowda, MD

## 2023-06-01 LAB — CBC WITH DIFFERENTIAL/PLATELET
Basophils Absolute: 0 10*3/uL (ref 0.0–0.2)
Basos: 1 %
EOS (ABSOLUTE): 0.1 10*3/uL (ref 0.0–0.4)
Eos: 2 %
Hematocrit: 37.6 % (ref 37.5–51.0)
Hemoglobin: 12.2 g/dL — ABNORMAL LOW (ref 13.0–17.7)
Immature Grans (Abs): 0 10*3/uL (ref 0.0–0.1)
Immature Granulocytes: 0 %
Lymphocytes Absolute: 1.4 10*3/uL (ref 0.7–3.1)
Lymphs: 37 %
MCH: 27.9 pg (ref 26.6–33.0)
MCHC: 32.4 g/dL (ref 31.5–35.7)
MCV: 86 fL (ref 79–97)
Monocytes Absolute: 0.4 10*3/uL (ref 0.1–0.9)
Monocytes: 9 %
Neutrophils Absolute: 2 10*3/uL (ref 1.4–7.0)
Neutrophils: 51 %
Platelets: 242 10*3/uL (ref 150–450)
RBC: 4.38 x10E6/uL (ref 4.14–5.80)
RDW: 14.1 % (ref 11.6–15.4)
WBC: 3.9 10*3/uL (ref 3.4–10.8)

## 2023-06-01 LAB — LIPID PANEL
Chol/HDL Ratio: 2.7 ratio (ref 0.0–5.0)
Cholesterol, Total: 119 mg/dL (ref 100–199)
HDL: 44 mg/dL (ref >39)
LDL Chol Calc (NIH): 62 mg/dL (ref 0–99)
Triglycerides: 59 mg/dL (ref 0–149)
VLDL Cholesterol Cal: 13 mg/dL (ref 5–40)

## 2023-06-01 LAB — COMPREHENSIVE METABOLIC PANEL
ALT: 14 IU/L (ref 0–44)
AST: 22 IU/L (ref 0–40)
Albumin: 4.3 g/dL (ref 3.9–4.9)
Alkaline Phosphatase: 54 IU/L (ref 44–121)
BUN/Creatinine Ratio: 11 (ref 10–24)
BUN: 11 mg/dL (ref 8–27)
Bilirubin Total: 0.8 mg/dL (ref 0.0–1.2)
CO2: 23 mmol/L (ref 20–29)
Calcium: 9.4 mg/dL (ref 8.6–10.2)
Chloride: 105 mmol/L (ref 96–106)
Creatinine, Ser: 1.04 mg/dL (ref 0.76–1.27)
Globulin, Total: 2.3 g/dL (ref 1.5–4.5)
Glucose: 76 mg/dL (ref 70–99)
Potassium: 4 mmol/L (ref 3.5–5.2)
Sodium: 142 mmol/L (ref 134–144)
Total Protein: 6.6 g/dL (ref 6.0–8.5)
eGFR: 81 mL/min/{1.73_m2} (ref >59)

## 2023-06-06 IMAGING — MR MR 3D RECON AT SCANNER
16 of 19 series · 38 of 48 positions shown · IV contrast (9 GADAVIST)
Comparison: CT abdomen 01/21/2022

CLINICAL DATA: Right upper quadrant pain

EXAM:
MRI ABDOMEN WITHOUT AND WITH CONTRAST (INCLUDING MRCP)
TECHNIQUE: Multiplanar multisequence MR imaging of the abdomen was performed
both before and after the administration of intravenous contrast.
Heavily T2-weighted images of the biliary and pancreatic ducts were
obtained, and three-dimensional MRCP images were rendered by post
processing.
CONTRAST:  9mL GADAVIST GADOBUTROL 1 MMOL/ML IV SOLN

[Series 4: T2 fat-sat · axial · 6.0mm · 1.25mm/px · 1 of 38 slices shown]
[im 1/38]
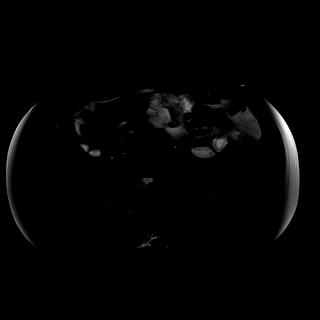

[Series 7: T2 · coronal · 6.0mm · 1.48mm/px · 1 of 30 slices shown (1 of 2)]
[im 1/30]
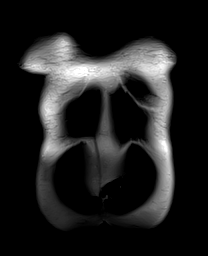

[Series 8: DWI · axial · 6.0mm · 1.49mm/px · z∈[-99,+182]mm · 2 of 80 slices shown (1 of 2)]
[im 1/80]
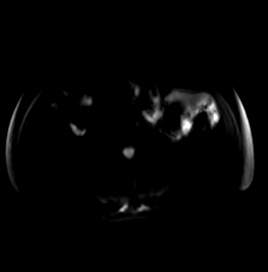
[im 80/80]
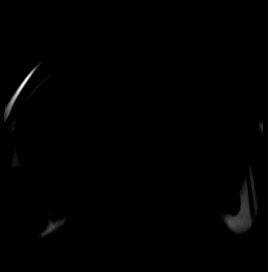

[Series 9: DWI · axial · 6.0mm · 1.49mm/px · 1 of 40 slices shown (2 of 2)]
[im 1/40]
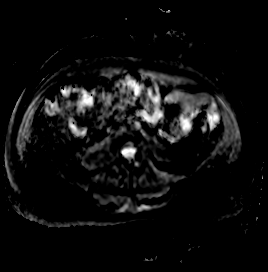

[Series 10: T1 · axial · 3.0mm · 1.25mm/px · z∈[-78,+183]mm · 3 of 88 slices shown (1 of 2)]
[im 1/88]
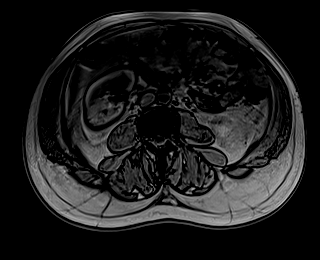
[im 44/88]
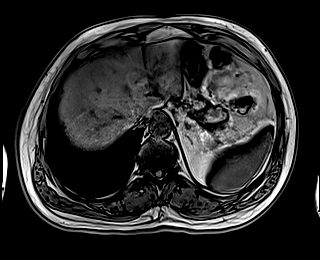
[im 88/88]
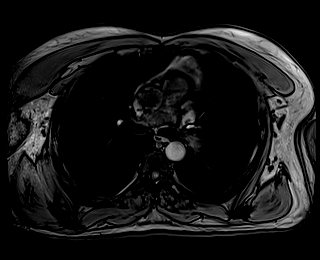

[Series 11: T1 · axial · 3.0mm · 1.25mm/px · z∈[-78,+183]mm · 3 of 88 slices shown (2 of 2)]
[im 1/88]
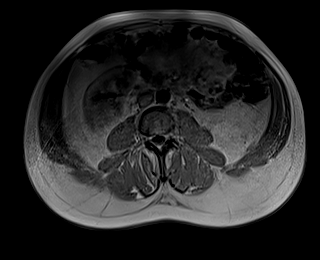
[im 44/88]
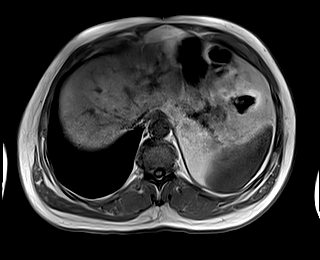
[im 88/88]
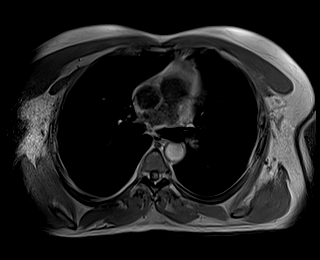

[Series 12: cor obl thk · sagittal · 50.0mm · 0.78mm/px · 1 of 9 slices shown]
[im 1/9]
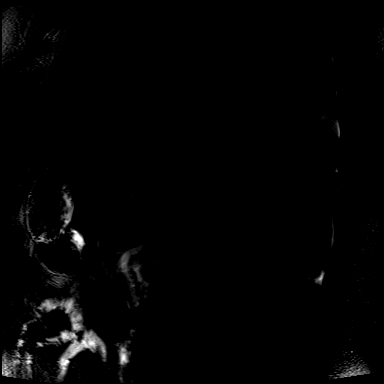

[Series 13: cor_3d_spc_trig-resp · 1 of 8 slices shown]
[im 1/8]
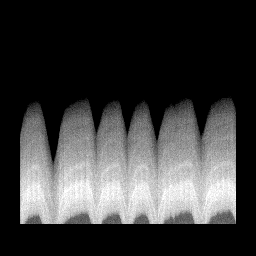

[Series 14: cor_3d_spc_trig · coronal · 1.0mm · 0.49mm/px · 3 of 72 slices shown]
[im 1/72]
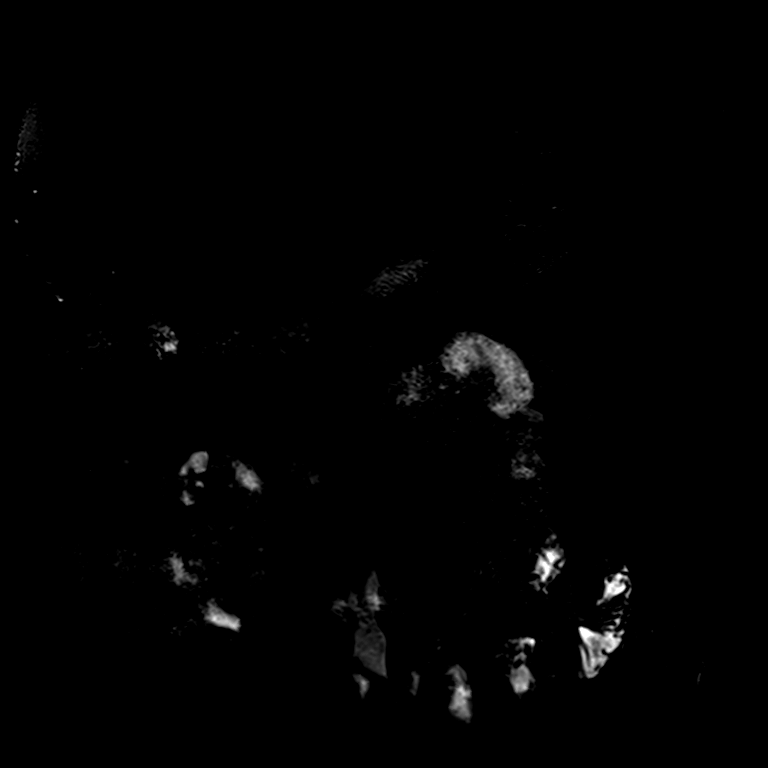
[im 36/72]
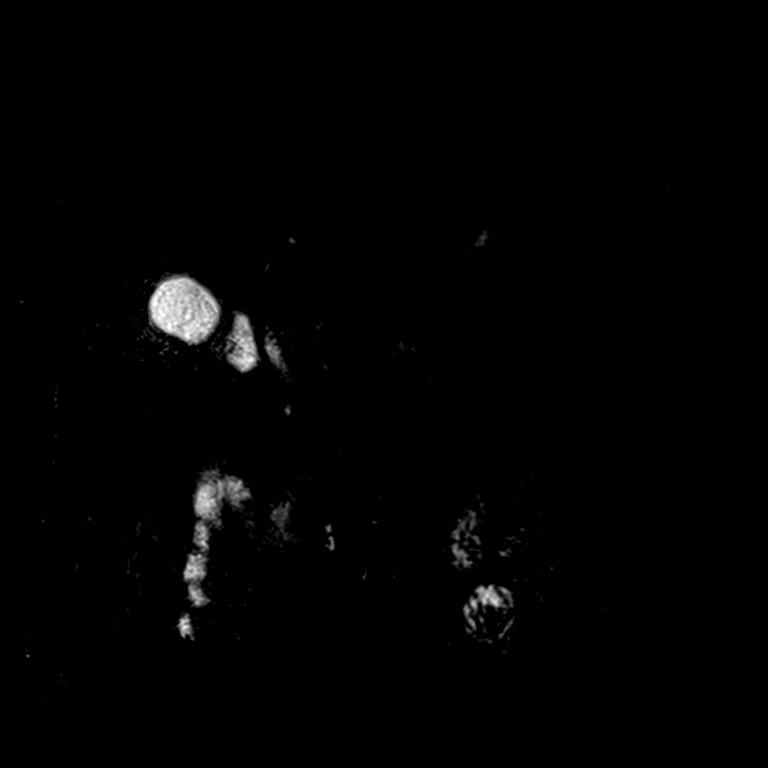
[im 72/72]
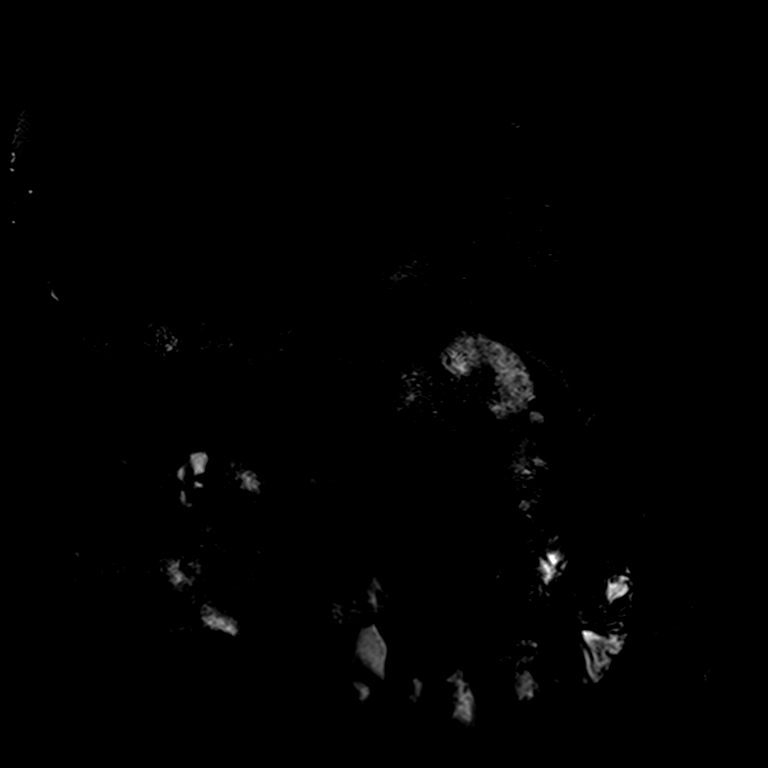

[Series 16: T2 · axial · 6.0mm · 1.56mm/px · 1 of 38 slices shown (2 of 2)]
[im 1/38]
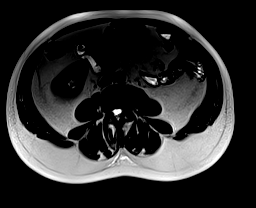

[Series 18: T1 dynamic · axial · 3.0mm · 1.25mm/px · z∈[-92,+193]mm · 4 of 96 slices shown (1 of 6)]
[im 1/96]
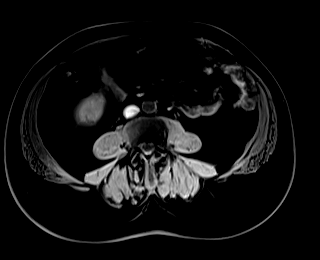
[im 32/96]
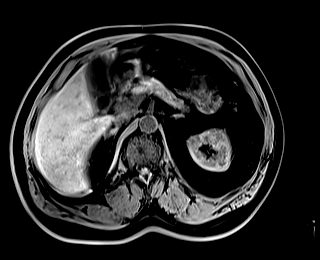
[im 64/96]
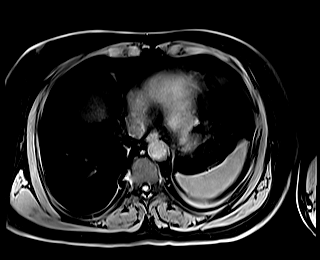
[im 96/96]
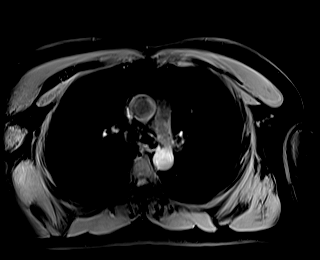

[Series 22: T1 dynamic · axial · 3.0mm · 1.25mm/px · z∈[-86,+193]mm · 3 of 91 slices shown (2 of 6)]
[im 1/91]
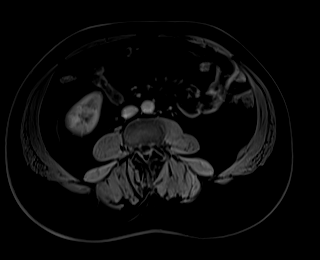
[im 46/91]
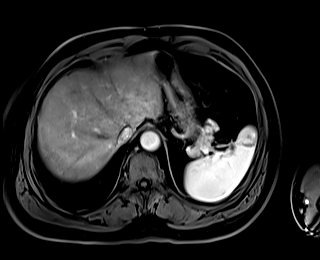
[im 91/91]
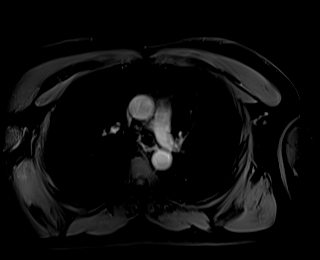

[Series 26: T1 dynamic · axial · 3.0mm · 1.25mm/px · z∈[-92,+193]mm · 4 of 95 slices shown (3 of 6)]
[im 1/95]
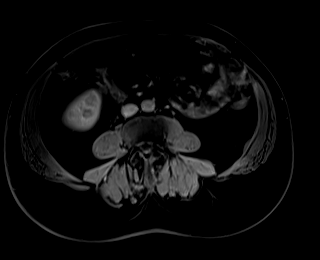
[im 32/95]
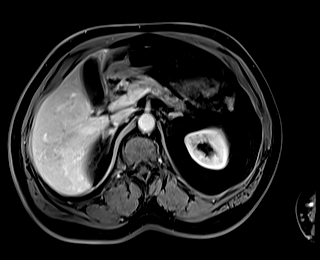
[im 63/95]
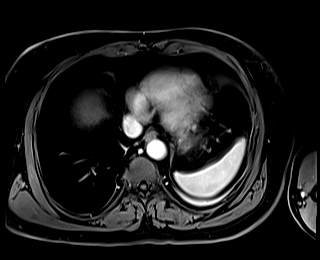
[im 95/95]
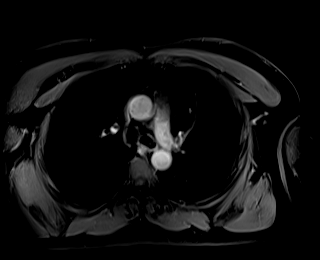

[Series 27: T1 dynamic · axial · 3.0mm · 1.25mm/px · z∈[-92,+193]mm · 4 of 96 slices shown (4 of 6)]
[im 1/96]
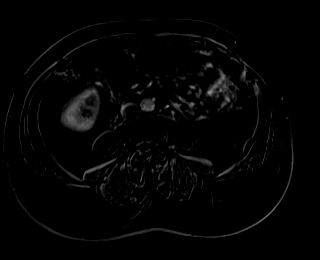
[im 32/96]
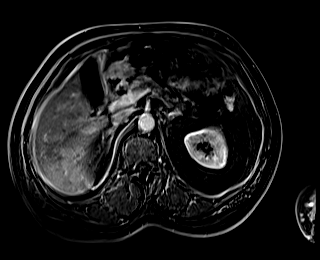
[im 64/96]
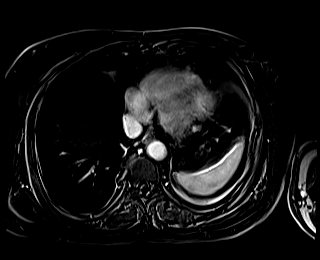
[im 96/96]
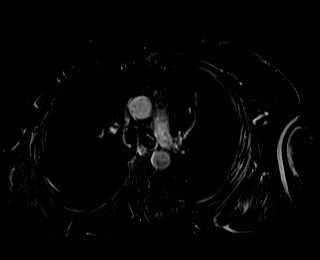

[Series 30: T1 dynamic · axial · 3.0mm · 1.25mm/px · z∈[-92,+193]mm · 4 of 94 slices shown (5 of 6)]
[im 1/94]
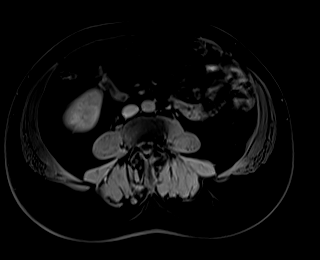
[im 32/94]
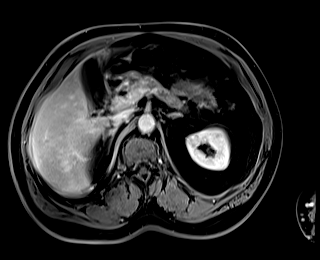
[im 63/94]
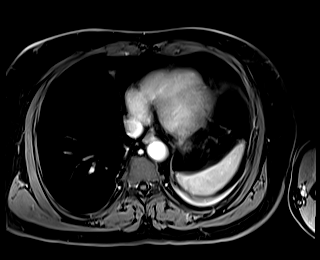
[im 94/94]
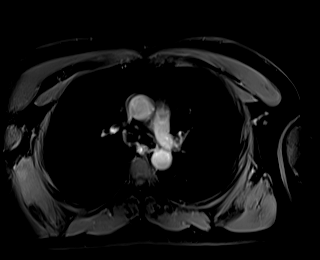

[Series 31: T1 dynamic · axial · 3.0mm · 1.25mm/px · z∈[-92,+1]mm · 2 of 96 slices shown (6 of 6)]
[im 1/96]
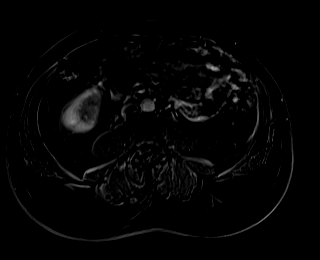
[im 32/96]
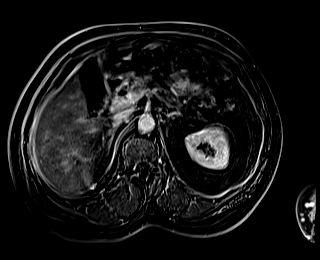

[38 of 48 positions shown; findings below may reference images not displayed]

FINDINGS: Study is significantly limited due to motion.

Lower chest: Trace bilateral pleural effusions. Atelectatic changes
at the left lung base. Elevated left hemidiaphragm.

Hepatobiliary: Liver is normal in size and contour with no
suspicious mass identified. No evidence of hepatic steatosis. 2 cm
gallstone. Mild gallbladder wall thickening. Changes of focal
adenomyomatosis at the fundus of the gallbladder noted. No
significant biliary ductal dilatation identified. Common bile duct
is upper normal caliber measuring 6.5 mm in diameter. No
choledocholithiasis visualized given limitations.

Pancreas: No mass, inflammatory changes, or other parenchymal
abnormality identified.

Spleen:  Within normal limits in size and appearance.

Adrenals/Urinary Tract: Adrenal glands appear normal. There appear
to be a few small renal cortical cysts bilaterally. No suspicious
renal mass or hydronephrosis appreciated.

Stomach/Bowel: Visualized portions within the abdomen are
unremarkable.

Vascular/Lymphatic: No pathologically enlarged lymph nodes
identified. No abdominal aortic aneurysm demonstrated.

Other:  No ascites.

Musculoskeletal: No suspicious bone lesions identified.
IMPRESSION: 1. Cholelithiasis with mild gallbladder wall thickening. Correlate
clinically.
2. No definite choledocholithiasis or significant biliary ductal
dilatation identified, given limitations of motion.
3. Trace bilateral pleural effusions. Atelectatic changes at the
left lung base.

## 2023-11-07 ENCOUNTER — Encounter: Payer: Self-pay | Admitting: Internal Medicine

## 2024-01-24 ENCOUNTER — Ambulatory Visit: Admitting: Family Medicine

## 2024-01-25 ENCOUNTER — Encounter: Payer: Self-pay | Admitting: Family Medicine

## 2024-01-31 ENCOUNTER — Ambulatory Visit: Admitting: Family Medicine

## 2024-01-31 VITALS — BP 132/78 | HR 76 | Wt 208.6 lb

## 2024-01-31 DIAGNOSIS — N433 Hydrocele, unspecified: Secondary | ICD-10-CM

## 2024-01-31 DIAGNOSIS — M25551 Pain in right hip: Secondary | ICD-10-CM | POA: Diagnosis not present

## 2024-01-31 DIAGNOSIS — M25552 Pain in left hip: Secondary | ICD-10-CM

## 2024-01-31 DIAGNOSIS — Z8719 Personal history of other diseases of the digestive system: Secondary | ICD-10-CM

## 2024-01-31 DIAGNOSIS — Z9889 Other specified postprocedural states: Secondary | ICD-10-CM

## 2024-01-31 NOTE — Patient Instructions (Signed)
 Try 2 Tylenol  4 times per day for the hip pain and if that does not work add 2 Aleve twice per day

## 2024-01-31 NOTE — Progress Notes (Signed)
   Subjective:    Patient ID: Richard Peters, male    DOB: [birth year], 63 y.o.   MRN: [id number]  HPI He is here for evaluation of a several year history of bilateral hip pain.  He now states that it has gotten much worse and he notes pain with sitting as well as with walking.  He has not had this evaluated yet.  He also has had a previous hydrocelectomy and notes that he is again having swelling in the same area.  He has also had bilateral hernia repair.   Review of Systems     Objective:    Physical Exam Alert and observed walking with an antalgic gait.  Flexion as well as internal and external rotation of the hip is quite painful and very limited.  Genital exam does show swelling of the left testicular area with the lesion lighting up        Assessment & Plan:  Bilateral hip pain - Plan: DG Hip Unilat W OR W/O Pelvis 2-3 Views Left, DG Hip Unilat W OR W/O Pelvis 2-3 Views Right  Hydrocele, unspecified hydrocele type - Plan: Ambulatory referral to Urology  Explained that I thought the hip pain was arthritis and we will start with that.  Recommend Tylenol  as well as Aleve to help with the discomfort until we get the x-rays.  Refer back to urology for the reexcision of the hydrocele.

## 2024-02-01 ENCOUNTER — Ambulatory Visit
Admission: RE | Admit: 2024-02-01 | Discharge: 2024-02-01 | Disposition: A | Discharge status: Home / Self Care | Source: Ambulatory Visit | Attending: Family Medicine | Admitting: Family Medicine

## 2024-02-01 DIAGNOSIS — M160 Bilateral primary osteoarthritis of hip: Secondary | ICD-10-CM | POA: Diagnosis not present

## 2024-02-07 ENCOUNTER — Other Ambulatory Visit: Payer: Self-pay

## 2024-02-07 DIAGNOSIS — M25551 Pain in right hip: Secondary | ICD-10-CM

## 2024-02-07 DIAGNOSIS — M160 Bilateral primary osteoarthritis of hip: Secondary | ICD-10-CM

## 2024-02-12 ENCOUNTER — Ambulatory Visit: Payer: Self-pay | Admitting: Family Medicine

## 2024-02-15 ENCOUNTER — Ambulatory Visit: Admitting: Family Medicine

## 2024-02-26 ENCOUNTER — Encounter: Payer: Self-pay | Admitting: Physician Assistant

## 2024-02-26 ENCOUNTER — Ambulatory Visit (INDEPENDENT_AMBULATORY_CARE_PROVIDER_SITE_OTHER): Admitting: Physician Assistant

## 2024-02-26 DIAGNOSIS — M160 Bilateral primary osteoarthritis of hip: Secondary | ICD-10-CM | POA: Diagnosis not present

## 2024-02-26 DIAGNOSIS — M1611 Unilateral primary osteoarthritis, right hip: Secondary | ICD-10-CM

## 2024-02-26 DIAGNOSIS — M1612 Unilateral primary osteoarthritis, left hip: Secondary | ICD-10-CM | POA: Diagnosis not present

## 2024-02-26 NOTE — Progress Notes (Signed)
 Office Visit Note   Patient: Richard Peters           Date of Birth: 26-Aug-1961           MRN: [id number] Visit Date: 02/26/2024              Requested by: Watson Hacking, MD [address] [place],  [place] [address] PCP: Watson Hacking, MD   Assessment & Plan: Visit Diagnoses:  1. Primary osteoarthritis of right hip   2. Primary osteoarthritis of left hip     Plan: Given the severe end-stage arthritis of both hips recommend total hip arthroplasty.  Risk benefits of total hip arthroplasty discussed with the patient and his wife who is present today at length.  Risk include but are not limited to DVT/PE, wound healing problems, infection, leg length discrepancy, nerve vessel injury, and blood loss.  Patient would like to proceed with a right total hip arthroplasty in the near future.  Will work on scheduling for right total hip arthroplasty.  Questions were encouraged and answered by Dr. Alvester Johnson and myself.  Handout on direct anterior hip arthroplasty was given.  Examples of implants were shown to the patient.  Follow-Up Instructions: Return for post op.   Orders:  No orders of the defined types were placed in this encounter.  No orders of the defined types were placed in this encounter.     Procedures: No procedures performed   Clinical Data: No additional findings.   Subjective: Chief Complaint  Patient presents with   Right Hip - Pain   Left Hip - Pain    HPI Patient 63 year old male were seen for the first time comes in today with bilateral hip pain.  States that both hips have been bothering him for several years.  No known injury.  Has groin pain.  Occasionally has left-sided electric like zap to the knee from the groin.  Ranks his pain to be constant 3 out of 10 but can be as much is 8 out of 10 pain at worst.  He has difficulty transitioning from a sitting position to standing position.  He also states that he has pain whenever he sits for too long.  He has  difficulty getting to sleep at night due to bilateral hip pain.  He uses no assistive device.  He works as an [employer] officer.  Said no treatment ordered and is taking no medications for the hip pain.  Presents today with his wife to talk about options for his bilateral hip pain.  Radiographs dated 02/12/2024 are reviewed with the patient.  AP pelvis and lateral view of bilateral hips shows severe end-stage arthritis of both hips.  With protrusio of the right hip.  Femoral heads with subchondral cystic changes.  No evidence of acute fracture. . Review of Systems Denies any fevers, chills, ongoing infection, chest pain shortness of breath, history of DVT or PE.  Objective: Vital Signs: There were no vitals taken for this visit.  Physical Exam General: Well-developed well-nourished male no acute distress mood affect appropriate.  Psych: Alert and oriented x 3 respirations unlabored. Ortho Exam Bilateral hips: Virtually no internal or external rotation of either hip attempts because pain.  Calves are supple nontender dorsiflexion plantarflexion bilateral ankles intact.  Walks with antalgic gait with no assistive device. Specialty Comments:  No specialty comments available.  Imaging: No results found.   PMFS History: Patient Active Problem List   Diagnosis Date Noted   S/P laparoscopic cholecystectomy 05/02/2022  History of hydrocelectomy 05/02/2022   History reviewed. No pertinent past medical history.  History reviewed. No pertinent family history.  Past Surgical History:  Procedure Laterality Date   CHOLECYSTECTOMY N/A 01/24/2022   Procedure: LAPAROSCOPIC CHOLECYSTECTOMY WITH INTRAOPERATIVE CHOLANGIOGRAM;  Surgeon: Dareen Ebbing, MD;  Location: [facility];  Service: General;  Laterality: N/A;   HERNIA REPAIR     HYDROCELE EXCISION / REPAIR     Social History   Occupational History   Not on file  Tobacco Use   Smoking status: Never   Smokeless tobacco: Never  Vaping Use   Vaping  status: Never Used  Substance and Sexual Activity   Alcohol use: No   Drug use: No   Sexual activity: Not Currently

## 2024-03-08 ENCOUNTER — Telehealth: Payer: Self-pay

## 2024-03-08 NOTE — Telephone Encounter (Signed)
 I called patient to discuss scheduling surgery.  Left voice mail message for return call.

## 2024-03-14 ENCOUNTER — Telehealth: Payer: Self-pay

## 2024-03-14 NOTE — Telephone Encounter (Signed)
 Patient called back and left me a voice mail.  I returned his call about scheduling surgery.  Left voice mail message for him to call me back.

## 2024-03-20 NOTE — Progress Notes (Signed)
 Sent message, via epic in basket, requesting orders in epic from Careers adviser.

## 2024-03-26 NOTE — Patient Instructions (Signed)
 SURGICAL WAITING ROOM VISITATION  Patients having surgery or a procedure may have no more than 2 support people in the waiting area - these visitors may rotate.    Children under the age of 27 must have an adult with them who is not the patient.  Visitors with respiratory illnesses are discouraged from visiting and should remain at home.  If the patient needs to stay at the hospital during part of their recovery, the visitor guidelines for inpatient rooms apply. Pre-op nurse will coordinate an appropriate time for 1 support person to accompany patient in pre-op.  This support person may not rotate.    Please refer to the [facility] website for the visitor guidelines for Inpatients (after your surgery is over and you are in a regular room).       Your procedure is scheduled on: 04/05/24   Report to [facility] Main Entrance    Report to admitting at : 6:15 AM   Call this number if you have problems the morning of surgery [phone]   Do not eat food :After Midnight.   After Midnight you may have the following liquids until : 5:45 AM DAY OF SURGERY  Water Non-Citrus Juices (without pulp, NO RED-Apple, White grape, White cranberry) Black Coffee (NO MILK/CREAM OR CREAMERS, sugar ok)  Clear Tea (NO MILK/CREAM OR CREAMERS, sugar ok) regular and decaf                             Plain Jell-O (NO RED)                                           Fruit ices (not with fruit pulp, NO RED)                                     Popsicles (NO RED)                                                               Sports drinks like Gatorade (NO RED)   The day of surgery:  Drink ONE (1) Pre-Surgery Clear Ensure at : 5:45 AM the morning of surgery. Drink in one sitting. Do not sip.  This drink was given to you during your hospital  pre-op appointment visit. Nothing else to drink after completing the  Pre-Surgery Clear Ensure or G2.          If you have questions, please contact your  surgeon's office.  FOLLOW ANY ADDITIONAL PRE OP INSTRUCTIONS YOU RECEIVED FROM YOUR SURGEON'S OFFICE!!!   Oral Hygiene is also important to reduce your risk of infection.                                    Remember - BRUSH YOUR TEETH THE MORNING OF SURGERY WITH YOUR REGULAR TOOTHPASTE  DENTURES WILL BE REMOVED PRIOR TO SURGERY PLEASE DO NOT APPLY Poly grip OR ADHESIVES!!!   Do NOT smoke after Midnight   Stop all  vitamins and herbal supplements 7 days before surgery.   Take these medicines the morning of surgery with A SIP OF WATER: NONE.                              You may not have any metal on your body including hair pins, jewelry, and body piercing             Do not wear lotions, powders, perfumes/cologne, or deodorant              Men may shave face and neck.   Do not bring valuables to the hospital. [facility] IS NOT             RESPONSIBLE   FOR VALUABLES.   Contacts, glasses, dentures or bridgework may not be worn into surgery.   Bring small overnight bag day of surgery.   DO NOT BRING YOUR HOME MEDICATIONS TO THE HOSPITAL. PHARMACY WILL DISPENSE MEDICATIONS LISTED ON YOUR MEDICATION LIST TO YOU DURING YOUR ADMISSION IN THE HOSPITAL!    Patients discharged on the day of surgery will not be allowed to drive home.  Someone NEEDS to stay with you for the first 24 hours after anesthesia.   Special Instructions: Bring a copy of your healthcare power of attorney and living will documents the day of surgery if you haven't scanned them before.              Please read over the following fact sheets you were given: IF YOU HAVE QUESTIONS ABOUT YOUR PRE-OP INSTRUCTIONS PLEASE CALL [phone]   If you received a COVID test during your pre-op visit  it is requested that you wear a mask when out in public, stay away from anyone that may not be feeling well and notify your surgeon if you develop symptoms. If you test positive for Covid or have been in contact with anyone that has  tested positive in the last 10 days please notify you surgeon.      Pre-operative 5 CHG Bath Instructions   You can play a key role in reducing the risk of infection after surgery. Your skin needs to be as free of germs as possible. You can reduce the number of germs on your skin by washing with CHG (chlorhexidine  gluconate) soap before surgery. CHG is an antiseptic soap that kills germs and continues to kill germs even after washing.   DO NOT use if you have an allergy to chlorhexidine /CHG or antibacterial soaps. If your skin becomes reddened or irritated, stop using the CHG and notify one of our RNs at [phone].   Please shower with the CHG soap starting 4 days before surgery using the following schedule:     Please keep in mind the following:  DO NOT shave, including legs and underarms, starting the day of your first shower.   You may shave your face at any point before/day of surgery.  Place clean sheets on your bed the day you start using CHG soap. Use a clean washcloth (not used since being washed) for each shower. DO NOT sleep with pets once you start using the CHG.   CHG Shower Instructions:  If you choose to wash your hair and private area, wash first with your normal shampoo/soap.  After you use shampoo/soap, rinse your hair and body thoroughly to remove shampoo/soap residue.  Turn the water OFF and apply about 3 tablespoons (45 ml) of CHG soap to  a Animal nutritionist.  Apply CHG soap ONLY FROM YOUR NECK DOWN TO YOUR TOES (washing for 3-5 minutes)  DO NOT use CHG soap on face, private areas, open wounds, or sores.  Pay special attention to the area where your surgery is being performed.  If you are having back surgery, having someone wash your back for you may be helpful. Wait 2 minutes after CHG soap is applied, then you may rinse off the CHG soap.  Pat dry with a clean towel  Put on clean clothes/pajamas   If you choose to wear lotion, please use ONLY the CHG-compatible  lotions on the back of this paper.     Additional instructions for the day of surgery: DO NOT APPLY any lotions, deodorants, cologne, or perfumes.   Put on clean/comfortable clothes.  Brush your teeth.  Ask your nurse before applying any prescription medications to the skin.   CHG Compatible Lotions   Aveeno Moisturizing lotion  Cetaphil Moisturizing Cream  Cetaphil Moisturizing Lotion  Clairol Herbal Essence Moisturizing Lotion, Dry Skin  Clairol Herbal Essence Moisturizing Lotion, Extra Dry Skin  Clairol Herbal Essence Moisturizing Lotion, Normal Skin  Curel Age Defying Therapeutic Moisturizing Lotion with Alpha Hydroxy  Curel Extreme Care Body Lotion  Curel Soothing Hands Moisturizing Hand Lotion  Curel Therapeutic Moisturizing Cream, Fragrance-Free  Curel Therapeutic Moisturizing Lotion, Fragrance-Free  Curel Therapeutic Moisturizing Lotion, Original Formula  Eucerin Daily Replenishing Lotion  Eucerin Dry Skin Therapy Plus Alpha Hydroxy Crme  Eucerin Dry Skin Therapy Plus Alpha Hydroxy Lotion  Eucerin Original Crme  Eucerin Original Lotion  Eucerin Plus Crme Eucerin Plus Lotion  Eucerin TriLipid Replenishing Lotion  Keri Anti-Bacterial Hand Lotion  Keri Deep Conditioning Original Lotion Dry Skin Formula Softly Scented  Keri Deep Conditioning Original Lotion, Fragrance Free Sensitive Skin Formula  Keri Lotion Fast Absorbing Fragrance Free Sensitive Skin Formula  Keri Lotion Fast Absorbing Softly Scented Dry Skin Formula  Keri Original Lotion  Keri Skin Renewal Lotion Keri Silky Smooth Lotion  Keri Silky Smooth Sensitive Skin Lotion  Nivea Body Creamy Conditioning Oil  Nivea Body Extra Enriched Lotion  Nivea Body Original Lotion  Nivea Body Sheer Moisturizing Lotion Nivea Crme  Nivea Skin Firming Lotion  NutraDerm 30 Skin Lotion  NutraDerm Skin Lotion  NutraDerm Therapeutic Skin Cream  NutraDerm Therapeutic Skin Lotion  ProShield Protective Hand Cream  Provon  moisturizing lotion   Incentive Spirometer  An incentive spirometer is a tool that can help keep your lungs clear and active. This tool measures how well you are filling your lungs with each breath. Taking long deep breaths may help reverse or decrease the chance of developing breathing (pulmonary) problems (especially infection) following: A long period of time when you are unable to move or be active. BEFORE THE PROCEDURE  If the spirometer includes an indicator to show your best effort, your nurse or respiratory therapist will set it to a desired goal. If possible, sit up straight or lean slightly forward. Try not to slouch. Hold the incentive spirometer in an upright position. INSTRUCTIONS FOR USE  Sit on the edge of your bed if possible, or sit up as far as you can in bed or on a chair. Hold the incentive spirometer in an upright position. Breathe out normally. Place the mouthpiece in your mouth and seal your lips tightly around it. Breathe in slowly and as deeply as possible, raising the piston or the ball toward the top of the column. Hold your breath for 3-5 seconds or  for as long as possible. Allow the piston or ball to fall to the bottom of the column. Remove the mouthpiece from your mouth and breathe out normally. Rest for a few seconds and repeat Steps 1 through 7 at least 10 times every 1-2 hours when you are awake. Take your time and take a few normal breaths between deep breaths. The spirometer may include an indicator to show your best effort. Use the indicator as a goal to work toward during each repetition. After each set of 10 deep breaths, practice coughing to be sure your lungs are clear. If you have an incision (the cut made at the time of surgery), support your incision when coughing by placing a pillow or rolled up towels firmly against it. Once you are able to get out of bed, walk around indoors and cough well. You may stop using the incentive spirometer when instructed  by your caregiver.  RISKS AND COMPLICATIONS Take your time so you do not get dizzy or light-headed. If you are in pain, you may need to take or ask for pain medication before doing incentive spirometry. It is harder to take a deep breath if you are having pain. AFTER USE Rest and breathe slowly and easily. It can be helpful to keep track of a log of your progress. Your caregiver can provide you with a simple table to help with this. If you are using the spirometer at home, follow these instructions: SEEK MEDICAL CARE IF:  You are having difficultly using the spirometer. You have trouble using the spirometer as often as instructed. Your pain medication is not giving enough relief while using the spirometer. You develop fever of 100.5 F (38.1 C) or higher. SEEK IMMEDIATE MEDICAL CARE IF:  You cough up bloody sputum that had not been present before. You develop fever of 102 F (38.9 C) or greater. You develop worsening pain at or near the incision site. MAKE SURE YOU:  Understand these instructions. Will watch your condition. Will get help right away if you are not doing well or get worse. Document Released: 01/09/2007 Document Revised: 11/21/2011 Document Reviewed: 03/12/2007 [facility] Patient Information 2014 [place], [place].   ________________________________________________________________________

## 2024-03-27 ENCOUNTER — Other Ambulatory Visit: Payer: Self-pay

## 2024-03-27 ENCOUNTER — Encounter (HOSPITAL_COMMUNITY): Payer: Self-pay

## 2024-03-27 ENCOUNTER — Encounter (HOSPITAL_COMMUNITY)
Admission: RE | Admit: 2024-03-27 | Discharge: 2024-03-27 | Disposition: A | Discharge status: Home / Self Care | Source: Ambulatory Visit | Attending: Orthopaedic Surgery | Admitting: Orthopaedic Surgery

## 2024-03-27 VITALS — BP 142/98 | HR 80 | Temp 98.7°F | Ht 72.0 in | Wt 205.0 lb

## 2024-03-27 DIAGNOSIS — Z01812 Encounter for preprocedural laboratory examination: Secondary | ICD-10-CM | POA: Diagnosis not present

## 2024-03-27 DIAGNOSIS — Z01818 Encounter for other preprocedural examination: Secondary | ICD-10-CM

## 2024-03-27 DIAGNOSIS — M1611 Unilateral primary osteoarthritis, right hip: Secondary | ICD-10-CM | POA: Diagnosis not present

## 2024-03-27 HISTORY — DX: Unspecified osteoarthritis, unspecified site: M19.90

## 2024-03-27 LAB — CBC
HCT: 38.6 % — ABNORMAL LOW (ref 39.0–52.0)
Hemoglobin: 12.7 g/dL — ABNORMAL LOW (ref 13.0–17.0)
MCH: 28.2 pg (ref 26.0–34.0)
MCHC: 32.9 g/dL (ref 30.0–36.0)
MCV: 85.8 fL (ref 80.0–100.0)
Platelets: 232 K/uL (ref 150–400)
RBC: 4.5 MIL/uL (ref 4.22–5.81)
RDW: 13.9 % (ref 11.5–15.5)
WBC: 4.5 K/uL (ref 4.0–10.5)
nRBC: 0 % (ref 0.0–0.2)

## 2024-03-27 LAB — BASIC METABOLIC PANEL WITH GFR
Anion gap: 10 (ref 5–15)
BUN: 15 mg/dL (ref 8–23)
CO2: 25 mmol/L (ref 22–32)
Calcium: 9.8 mg/dL (ref 8.9–10.3)
Chloride: 110 mmol/L (ref 98–111)
Creatinine, Ser: 1.09 mg/dL (ref 0.61–1.24)
GFR, Estimated: >60 mL/min (ref >60)
Glucose, Bld: 82 mg/dL (ref 70–99)
Potassium: 3.5 mmol/L (ref 3.5–5.1)
Sodium: 145 mmol/L (ref 135–145)

## 2024-03-27 LAB — SURGICAL PCR SCREEN
MRSA, PCR: NEGATIVE
Staphylococcus aureus: NEGATIVE

## 2024-03-27 NOTE — Progress Notes (Signed)
 For Anesthesia: PCP - Joyce Norleen BROCKS, MD  Cardiologist - N/A  Bowel Prep reminder:  Chest x-ray -  EKG -  Stress Test -  ECHO -  Cardiac Cath -  Pacemaker/ICD device last checked: Pacemaker orders received: Device Rep notified:  Spinal Cord Stimulator:N/A  Sleep Study - N/A CPAP -   Fasting Blood Sugar - N/A Checks Blood Sugar _____ times a day Date and result of last Hgb A1c-  Last dose of GLP1 agonist- N/A GLP1 instructions:   Last dose of SGLT-2 inhibitors- N/A SGLT-2 instructions:   Blood Thinner Instructions:N/A Aspirin Instructions: Last Dose:  Activity level: Can go up a flight of stairs and activities of daily living without stopping and without chest pain and/or shortness of breath   Able to exercise without chest pain and/or shortness of breath  Anesthesia review:   Patient denies shortness of breath, fever, cough and chest pain at PAT appointment   Patient verbalized understanding of instructions that were reviewed over the telephone.

## 2024-04-04 DIAGNOSIS — M1611 Unilateral primary osteoarthritis, right hip: Secondary | ICD-10-CM | POA: Insufficient documentation

## 2024-04-04 NOTE — H&P (Signed)
 TOTAL HIP ADMISSION H&P  Patient is admitted for right total hip arthroplasty.  Subjective:  Chief Complaint: right hip pain  HPI: Richard Peters, 63 y.o. male, has a history of pain and functional disability in the right hip(s) due to arthritis and patient has failed non-surgical conservative treatments for greater than 12 weeks to include NSAID's and/or analgesics and activity modification.  Onset of symptoms was gradual starting several years ago with gradually worsening course since that time.The patient noted no past surgery on the right hip(s).  Patient currently rates pain in the right hip at 10 out of 10 with activity. Patient has night pain, worsening of pain with activity and weight bearing, trendelenberg gait, pain that interfers with activities of daily living, and pain with passive range of motion. Patient has evidence of subchondral cysts, subchondral sclerosis, periarticular osteophytes, and joint space narrowing by imaging studies. This condition presents safety issues increasing the risk of falls.  There is no current active infection.  Patient Active Problem List   Diagnosis Date Noted   Unilateral primary osteoarthritis, right hip 04/04/2024   S/P laparoscopic cholecystectomy 05/02/2022   History of hydrocelectomy 05/02/2022   Past Medical History:  Diagnosis Date   Arthritis     Past Surgical History:  Procedure Laterality Date   CHOLECYSTECTOMY N/A 01/24/2022   Procedure: LAPAROSCOPIC CHOLECYSTECTOMY WITH INTRAOPERATIVE CHOLANGIOGRAM;  Surgeon: Belinda Cough, MD;  Location: [facility];  Service: General;  Laterality: N/A;   HERNIA REPAIR     HYDROCELE EXCISION / REPAIR      No current facility-administered medications for this encounter.   Current Outpatient Medications  Medication Sig Dispense Refill Last Dose/Taking   Multiple Vitamin (MULTIVITAMIN WITH MINERALS) TABS tablet Take 1 tablet by mouth daily. One a day 50+   Taking   No Known Allergies  Social  History   Tobacco Use   Smoking status: Never   Smokeless tobacco: Never  Substance Use Topics   Alcohol use: No    No family history on file.   Review of Systems  Objective:  Physical Exam Vitals reviewed.  Constitutional:      Appearance: Normal appearance. He is normal weight.  HENT:     Head: Normocephalic and atraumatic.  Eyes:     Extraocular Movements: Extraocular movements intact.     Pupils: Pupils are equal, round, and reactive to light.  Cardiovascular:     Rate and Rhythm: Normal rate and regular rhythm.  Pulmonary:     Effort: Pulmonary effort is normal.     Breath sounds: Normal breath sounds.  Musculoskeletal:     Cervical back: Normal range of motion.     Right hip: Tenderness and bony tenderness present. Decreased range of motion. Decreased strength.  Neurological:     Mental Status: He is alert and oriented to person, place, and time.  Psychiatric:        Behavior: Behavior normal.     Vital signs in last 24 hours:    Labs:   Estimated body mass index is 27.8 kg/m as calculated from the following:   Height as of 03/27/24: 6' (1.829 m).   Weight as of 03/27/24: 93 kg.   Imaging Review Plain radiographs demonstrate severe degenerative joint disease of the right hip(s). The bone quality appears to be excellent for age and reported activity level.      Assessment/Plan:  End stage arthritis, right hip(s)  The patient history, physical examination, clinical judgement of the provider and imaging studies are  consistent with end stage degenerative joint disease of the right hip(s) and total hip arthroplasty is deemed medically necessary. The treatment options including medical management, injection therapy, arthroscopy and arthroplasty were discussed at length. The risks and benefits of total hip arthroplasty were presented and reviewed. The risks due to aseptic loosening, infection, stiffness, dislocation/subluxation,  thromboembolic complications  and other imponderables were discussed.  The patient acknowledged the explanation, agreed to proceed with the plan and consent was signed. Patient is being admitted for inpatient treatment for surgery, pain control, PT, OT, prophylactic antibiotics, VTE prophylaxis, progressive ambulation and ADL's and discharge planning.The patient is planning to be discharged home with home health services

## 2024-04-05 ENCOUNTER — Ambulatory Visit (HOSPITAL_COMMUNITY): Payer: Self-pay | Admitting: Anesthesiology

## 2024-04-05 ENCOUNTER — Encounter (HOSPITAL_COMMUNITY): Payer: Self-pay | Admitting: Anesthesiology

## 2024-04-05 ENCOUNTER — Other Ambulatory Visit: Payer: Self-pay

## 2024-04-05 ENCOUNTER — Observation Stay (HOSPITAL_COMMUNITY)
Admission: RE | Admit: 2024-04-05 | Discharge: 2024-04-06 | Disposition: A | Discharge status: Home / Self Care | Attending: Orthopaedic Surgery | Admitting: Orthopaedic Surgery

## 2024-04-05 ENCOUNTER — Encounter (HOSPITAL_COMMUNITY): Payer: Self-pay | Admitting: Orthopaedic Surgery

## 2024-04-05 ENCOUNTER — Ambulatory Visit (HOSPITAL_COMMUNITY)

## 2024-04-05 ENCOUNTER — Encounter (HOSPITAL_COMMUNITY)
Admission: RE | Disposition: A | Discharge status: Home / Self Care | Payer: Self-pay | Source: Home / Self Care | Attending: Orthopaedic Surgery

## 2024-04-05 ENCOUNTER — Observation Stay (HOSPITAL_COMMUNITY)

## 2024-04-05 DIAGNOSIS — Z96641 Presence of right artificial hip joint: Secondary | ICD-10-CM | POA: Diagnosis not present

## 2024-04-05 DIAGNOSIS — Z471 Aftercare following joint replacement surgery: Secondary | ICD-10-CM | POA: Diagnosis not present

## 2024-04-05 DIAGNOSIS — M1611 Unilateral primary osteoarthritis, right hip: Principal | ICD-10-CM | POA: Insufficient documentation

## 2024-04-05 HISTORY — PX: TOTAL HIP ARTHROPLASTY: SHX124

## 2024-04-05 LAB — TYPE AND SCREEN
ABO/RH(D): A NEG
Antibody Screen: NEGATIVE

## 2024-04-05 LAB — ABO/RH: ABO/RH(D): A NEG

## 2024-04-05 SURGERY — ARTHROPLASTY, HIP, TOTAL, ANTERIOR APPROACH
Anesthesia: Spinal | Site: Hip | Laterality: Right

## 2024-04-05 MED ORDER — FENTANYL CITRATE PF 50 MCG/ML IJ SOSY: 25.0000 ug | PREFILLED_SYRINGE | INTRAMUSCULAR | Status: DC | PRN

## 2024-04-05 MED ORDER — SODIUM CHLORIDE 0.9 % IV SOLN: INTRAVENOUS | Status: DC

## 2024-04-05 MED ORDER — AMISULPRIDE (ANTIEMETIC) 5 MG/2ML IV SOLN: 10.0000 mg | Freq: Once | INTRAVENOUS | Status: DC | PRN

## 2024-04-05 MED ORDER — METOCLOPRAMIDE HCL 5 MG/ML IJ SOLN: 5.0000 mg | Freq: Three times a day (TID) | INTRAMUSCULAR | Status: DC | PRN

## 2024-04-05 MED ORDER — TRANEXAMIC ACID-NACL 1000-0.7 MG/100ML-% IV SOLN
1000.0000 mg | INTRAVENOUS | Status: AC
  Administered 2024-04-05: 1000 mg via INTRAVENOUS
  Filled 2024-04-05: qty 100

## 2024-04-05 MED ORDER — METHOCARBAMOL 500 MG PO TABS
500.0000 mg | ORAL_TABLET | Freq: Four times a day (QID) | ORAL | Status: DC | PRN
  Administered 2024-04-06: 500 mg via ORAL
  Filled 2024-04-05: qty 1

## 2024-04-05 MED ORDER — PHENYLEPHRINE HCL-NACL 20-0.9 MG/250ML-% IV SOLN
INTRAVENOUS | Status: AC
  Filled 2024-04-05: qty 250

## 2024-04-05 MED ORDER — EPHEDRINE 5 MG/ML INJ
INTRAVENOUS | Status: AC
Start: 2024-04-05 — End: 2024-04-05
  Filled 2024-04-05: qty 5

## 2024-04-05 MED ORDER — ONDANSETRON HCL 4 MG/2ML IJ SOLN: 4.0000 mg | Freq: Four times a day (QID) | INTRAMUSCULAR | Status: DC | PRN

## 2024-04-05 MED ORDER — MIDAZOLAM HCL 5 MG/5ML IJ SOLN
INTRAMUSCULAR | Status: DC | PRN
Start: 2024-04-05 — End: 2024-04-05
  Administered 2024-04-05: 2 mg via INTRAVENOUS

## 2024-04-05 MED ORDER — PANTOPRAZOLE SODIUM 40 MG PO TBEC
40.0000 mg | DELAYED_RELEASE_TABLET | Freq: Every day | ORAL | Status: DC
  Administered 2024-04-05 – 2024-04-06 (×2): 40 mg via ORAL
  Filled 2024-04-05 (×2): qty 1

## 2024-04-05 MED ORDER — OXYCODONE HCL 5 MG PO TABS
5.0000 mg | ORAL_TABLET | ORAL | Status: DC | PRN
  Administered 2024-04-05: 5 mg via ORAL
  Administered 2024-04-05 – 2024-04-06 (×3): 10 mg via ORAL
  Filled 2024-04-05 (×3): qty 2
  Filled 2024-04-05: qty 1
  Filled 2024-04-05: qty 2

## 2024-04-05 MED ORDER — DOCUSATE SODIUM 100 MG PO CAPS
100.0000 mg | ORAL_CAPSULE | Freq: Two times a day (BID) | ORAL | Status: DC
  Administered 2024-04-05 – 2024-04-06 (×2): 100 mg via ORAL
  Filled 2024-04-05 (×2): qty 1

## 2024-04-05 MED ORDER — CHLORHEXIDINE GLUCONATE 0.12 % MT SOLN
15.0000 mL | Freq: Once | OROMUCOSAL | Status: AC
  Administered 2024-04-05: 15 mL via OROMUCOSAL

## 2024-04-05 MED ORDER — DEXAMETHASONE SODIUM PHOSPHATE 10 MG/ML IJ SOLN
INTRAMUSCULAR | Status: AC
  Filled 2024-04-05: qty 1

## 2024-04-05 MED ORDER — DEXAMETHASONE SODIUM PHOSPHATE 10 MG/ML IJ SOLN
INTRAMUSCULAR | Status: DC | PRN
  Administered 2024-04-05: 5 mg via INTRAVENOUS

## 2024-04-05 MED ORDER — ACETAMINOPHEN 325 MG PO TABS: 325.0000 mg | ORAL_TABLET | Freq: Four times a day (QID) | ORAL | Status: DC | PRN

## 2024-04-05 MED ORDER — ALUM & MAG HYDROXIDE-SIMETH 200-200-20 MG/5ML PO SUSP: 30.0000 mL | ORAL | Status: DC | PRN

## 2024-04-05 MED ORDER — PHENYLEPHRINE HCL-NACL 20-0.9 MG/250ML-% IV SOLN
INTRAVENOUS | Status: DC | PRN
  Administered 2024-04-05: 20 ug/min via INTRAVENOUS

## 2024-04-05 MED ORDER — MENTHOL 3 MG MT LOZG: 1.0000 | LOZENGE | OROMUCOSAL | Status: DC | PRN

## 2024-04-05 MED ORDER — HYDROMORPHONE HCL 1 MG/ML IJ SOLN: 0.5000 mg | INTRAMUSCULAR | Status: DC | PRN

## 2024-04-05 MED ORDER — ONDANSETRON HCL 4 MG/2ML IJ SOLN
INTRAMUSCULAR | Status: DC | PRN
  Administered 2024-04-05: 4 mg via INTRAVENOUS

## 2024-04-05 MED ORDER — ORAL CARE MOUTH RINSE: 15.0000 mL | OROMUCOSAL | Status: DC | PRN

## 2024-04-05 MED ORDER — ORAL CARE MOUTH RINSE: 15.0000 mL | Freq: Once | OROMUCOSAL | Status: AC

## 2024-04-05 MED ORDER — BUPIVACAINE IN DEXTROSE 0.75-8.25 % IT SOLN
INTRATHECAL | Status: DC | PRN
Start: 2024-04-05 — End: 2024-04-05
  Administered 2024-04-05: 2 mL via INTRATHECAL

## 2024-04-05 MED ORDER — METOCLOPRAMIDE HCL 5 MG PO TABS: 5.0000 mg | ORAL_TABLET | Freq: Three times a day (TID) | ORAL | Status: DC | PRN

## 2024-04-05 MED ORDER — ONDANSETRON HCL 4 MG PO TABS: 4.0000 mg | ORAL_TABLET | Freq: Four times a day (QID) | ORAL | Status: DC | PRN

## 2024-04-05 MED ORDER — PHENOL 1.4 % MT LIQD: 1.0000 | OROMUCOSAL | Status: DC | PRN

## 2024-04-05 MED ORDER — POVIDONE-IODINE 10 % EX SWAB: 2.0000 | Freq: Once | CUTANEOUS | Status: DC

## 2024-04-05 MED ORDER — LACTATED RINGERS IV SOLN: INTRAVENOUS | Status: DC

## 2024-04-05 MED ORDER — CEFAZOLIN SODIUM-DEXTROSE 2-4 GM/100ML-% IV SOLN
2.0000 g | Freq: Four times a day (QID) | INTRAVENOUS | Status: AC
  Administered 2024-04-05 (×2): 2 g via INTRAVENOUS
  Filled 2024-04-05 (×2): qty 100

## 2024-04-05 MED ORDER — KETOROLAC TROMETHAMINE 30 MG/ML IJ SOLN
INTRAMUSCULAR | Status: DC | PRN
  Administered 2024-04-05: 30 mg via INTRAVENOUS

## 2024-04-05 MED ORDER — ONDANSETRON HCL 4 MG/2ML IJ SOLN
INTRAMUSCULAR | Status: AC
  Filled 2024-04-05: qty 2

## 2024-04-05 MED ORDER — CEFAZOLIN SODIUM-DEXTROSE 2-4 GM/100ML-% IV SOLN
2.0000 g | INTRAVENOUS | Status: AC
  Administered 2024-04-05: 2 g via INTRAVENOUS
  Filled 2024-04-05: qty 100

## 2024-04-05 MED ORDER — 0.9 % SODIUM CHLORIDE (POUR BTL) OPTIME
TOPICAL | Status: DC | PRN
  Administered 2024-04-05: 1000 mL

## 2024-04-05 MED ORDER — DIPHENHYDRAMINE HCL 12.5 MG/5ML PO ELIX: 12.5000 mg | ORAL_SOLUTION | ORAL | Status: DC | PRN

## 2024-04-05 MED ORDER — KETOROLAC TROMETHAMINE 30 MG/ML IJ SOLN
INTRAMUSCULAR | Status: AC
  Filled 2024-04-05: qty 1

## 2024-04-05 MED ORDER — OXYCODONE HCL 5 MG PO TABS
10.0000 mg | ORAL_TABLET | ORAL | Status: DC | PRN
  Administered 2024-04-06: 10 mg via ORAL
  Filled 2024-04-05: qty 2

## 2024-04-05 MED ORDER — PROPOFOL 1000 MG/100ML IV EMUL
INTRAVENOUS | Status: AC
  Filled 2024-04-05: qty 100

## 2024-04-05 MED ORDER — ASPIRIN 81 MG PO CHEW
81.0000 mg | CHEWABLE_TABLET | Freq: Two times a day (BID) | ORAL | Status: DC
  Administered 2024-04-05 – 2024-04-06 (×2): 81 mg via ORAL
  Filled 2024-04-05 (×2): qty 1

## 2024-04-05 MED ORDER — POLYETHYLENE GLYCOL 3350 17 G PO PACK: 17.0000 g | PACK | Freq: Every day | ORAL | Status: DC | PRN

## 2024-04-05 MED ORDER — PROPOFOL 500 MG/50ML IV EMUL
INTRAVENOUS | Status: DC | PRN
  Administered 2024-04-05: 100 ug/kg/min via INTRAVENOUS

## 2024-04-05 MED ORDER — ACETAMINOPHEN 500 MG PO TABS
1000.0000 mg | ORAL_TABLET | Freq: Once | ORAL | Status: AC
  Administered 2024-04-05: 1000 mg via ORAL
  Filled 2024-04-05: qty 2

## 2024-04-05 MED ORDER — ONDANSETRON HCL 4 MG/2ML IJ SOLN: 4.0000 mg | Freq: Once | INTRAMUSCULAR | Status: DC | PRN

## 2024-04-05 MED ORDER — SODIUM CHLORIDE 0.9 % IR SOLN
Status: DC | PRN
  Administered 2024-04-05: 1000 mL

## 2024-04-05 MED ORDER — SCOPOLAMINE 1 MG/3DAYS TD PT72
1.0000 | MEDICATED_PATCH | Freq: Once | TRANSDERMAL | Status: DC
  Administered 2024-04-05: 1.5 mg via TRANSDERMAL
  Filled 2024-04-05: qty 1

## 2024-04-05 MED ORDER — METHOCARBAMOL 1000 MG/10ML IJ SOLN
500.0000 mg | Freq: Four times a day (QID) | INTRAMUSCULAR | Status: DC | PRN
Start: 2024-04-05 — End: 2024-04-06

## 2024-04-05 MED ORDER — PROPOFOL 10 MG/ML IV BOLUS
INTRAVENOUS | Status: DC | PRN
  Administered 2024-04-05: 30 mg via INTRAVENOUS

## 2024-04-05 MED ORDER — MIDAZOLAM HCL 2 MG/2ML IJ SOLN
INTRAMUSCULAR | Status: AC
  Filled 2024-04-05: qty 2

## 2024-04-05 SURGICAL SUPPLY — 30 items
BAG COUNTER SPONGE SURGICOUNT (BAG) ×1 IMPLANT
BAG ZIPLOCK 12X15 (MISCELLANEOUS) IMPLANT
BLADE SAW SGTL 18X1.27X75 (BLADE) ×1 IMPLANT
COVER PERINEAL POST (MISCELLANEOUS) ×1 IMPLANT
COVER SURGICAL LIGHT HANDLE (MISCELLANEOUS) ×1 IMPLANT
CUP SECTOR GRIPTON 58MM (Orthopedic Implant) IMPLANT
DRAPE FOOT SWITCH (DRAPES) ×1 IMPLANT
DRAPE STERI IOBAN 125X83 (DRAPES) ×1 IMPLANT
DRAPE U-SHAPE 47X51 STRL (DRAPES) ×2 IMPLANT
DRSG AQUACEL AG ADV 3.5X10 (GAUZE/BANDAGES/DRESSINGS) ×1 IMPLANT
DURAPREP 26ML APPLICATOR (WOUND CARE) ×1 IMPLANT
ELECT PENCIL ROCKER SW 15FT (MISCELLANEOUS) ×1 IMPLANT
ELECT REM PT RETURN 15FT ADLT (MISCELLANEOUS) ×1 IMPLANT
GLOVE BIO SURGEON STRL SZ7.5 (GLOVE) ×1 IMPLANT
GLOVE BIOGEL PI IND STRL 8 (GLOVE) ×2 IMPLANT
GLOVE ECLIPSE 8.0 STRL XLNG CF (GLOVE) ×1 IMPLANT
GOWN STRL REUS W/ TWL XL LVL3 (GOWN DISPOSABLE) ×2 IMPLANT
HEAD CERAMIC 36 PLUS5 (Hips) IMPLANT
KIT TURNOVER KIT A (KITS) ×1 IMPLANT
LINER NEUTRAL 36X58 PLUS4 IMPLANT
PACK ANTERIOR HIP CUSTOM (KITS) ×1 IMPLANT
SET HNDPC FAN SPRY TIP SCT (DISPOSABLE) ×1 IMPLANT
STAPLER SKIN PROX 35W (STAPLE) IMPLANT
STEM FEMORAL SZ5 HIGH ACTIS (Stem) IMPLANT
SUT ETHIBOND NAB CT1 #1 30IN (SUTURE) ×1 IMPLANT
SUT VIC AB 0 CT1 36 (SUTURE) ×1 IMPLANT
SUT VIC AB 1 CT1 36 (SUTURE) ×1 IMPLANT
SUT VIC AB 2-0 CT1 TAPERPNT 27 (SUTURE) ×2 IMPLANT
TRAY FOLEY MTR SLVR 16FR STAT (SET/KITS/TRAYS/PACK) IMPLANT
YANKAUER SUCT BULB TIP NO VENT (SUCTIONS) ×1 IMPLANT

## 2024-04-05 NOTE — Anesthesia Procedure Notes (Signed)
 Spinal  Patient location during procedure: OR Start time: 04/05/2024 8:56 AM End time: 04/05/2024 8:59 AM Reason for block: surgical anesthesia Staffing Performed: anesthesiologist  Anesthesiologist: Corinne Garnette BRAVO, MD Performed by: Corinne Garnette BRAVO, MD Authorized by: Corinne Garnette BRAVO, MD   Preanesthetic Checklist Completed: patient identified, IV checked, risks and benefits discussed, surgical consent, monitors and equipment checked, pre-op evaluation and timeout performed Spinal Block Patient position: sitting Prep: DuraPrep and site prepped and draped Patient monitoring: continuous pulse ox and blood pressure Approach: midline Location: L3-4 Injection technique: single-shot Needle Needle type: Pencan  Needle gauge: 24 G Assessment Events: CSF return Additional Notes Functioning IV was confirmed and monitors were applied. Sterile prep and drape, including hand hygiene, mask and sterile gloves were used. The patient was positioned and the spine was prepped. The skin was anesthetized with lidocaine .  Free flow of clear CSF was obtained prior to injecting local anesthetic into the CSF.  The spinal needle aspirated freely following injection.  The needle was carefully withdrawn.  The patient tolerated the procedure well. Consent was obtained prior to procedure with all questions answered and concerns addressed. Risks including but not limited to bleeding, infection, nerve damage, paralysis, failed block, inadequate analgesia, allergic reaction, high spinal, itching and headache were discussed and the patient wished to proceed.   Garnette Corinne, MD

## 2024-04-05 NOTE — Plan of Care (Signed)
  Problem: Health Behavior/Discharge Planning: Goal: Ability to manage health-related needs will improve Outcome: Progressing   Problem: Clinical Measurements: Goal: Ability to maintain clinical measurements within normal limits will improve Outcome: Progressing Goal: Will remain free from infection Outcome: Progressing Goal: Diagnostic test results will improve Outcome: Progressing Goal: Respiratory complications will improve Outcome: Progressing Goal: Cardiovascular complication will be avoided Outcome: Progressing   Problem: Activity: Goal: Risk for activity intolerance will decrease Outcome: Progressing   Problem: Coping: Goal: Level of anxiety will decrease Outcome: Progressing   Problem: Elimination: Goal: Will not experience complications related to bowel motility Outcome: Progressing Goal: Will not experience complications related to urinary retention Outcome: Progressing   Problem: Pain Managment: Goal: General experience of comfort will improve and/or be controlled Outcome: Progressing   Problem: Safety: Goal: Ability to remain free from injury will improve Outcome: Progressing   Problem: Skin Integrity: Goal: Risk for impaired skin integrity will decrease Outcome: Progressing   Problem: Education: Goal: Knowledge of the prescribed therapeutic regimen will improve Outcome: Progressing Goal: Understanding of discharge needs will improve Outcome: Progressing   Problem: Activity: Goal: Ability to avoid complications of mobility impairment will improve Outcome: Progressing Goal: Ability to tolerate increased activity will improve Outcome: Adequate for Discharge   Problem: Clinical Measurements: Goal: Postoperative complications will be avoided or minimized Outcome: Progressing   Problem: Pain Management: Goal: Pain level will decrease with appropriate interventions Outcome: Progressing   Problem: Skin Integrity: Goal: Will show signs of wound  healing Outcome: Progressing

## 2024-04-05 NOTE — Op Note (Signed)
 Operative Note  Date of operation: 04/05/2024 Preoperative diagnosis: Right hip primary osteoarthritis Postoperative diagnosis: Same  Procedure: Right direct anterior total hip arthroplasty  Implants: Implant Name Type Inv. Item Serial No. Manufacturer Lot No. LRB No. Used Action  CUP SECTOR GRIPTON - [id number] Orthopedic Implant CUP SECTOR GRIPTON  DEPUY ORTHOPAED[employer] [id number] Right 1 Implanted  LINER NEUTRAL 36X58 PLUS4 - [id number]  LINER NEUTRAL 36X58 PLUS4  DEPUY ORTHOPAEDICS [id number] Right 1 Implanted  STEM FEMORAL SZ5 HIGH ACTIS - [id number] Stem STEM FEMORAL SZ5 HIGH ACTIS  DEPUY ORTHOPAEDICS [id number] Right 1 Implanted  HEAD CERAMIC 36 PLUS5 - [id number] Hips HEAD CERAMIC 36 PLUS5  DEPUY ORTHOPAEDICS [id number] Right 1 Implanted   Surgeon: Lonni GRADE. Vernetta, MD Assistant: Tory Gaskins, PA-C  Anesthesia: Spinal EBL: 150 cc Antibiotics: IV Ancef Complications: None  Indications: The patient is a 63 year old gentleman with severe end-stage arthritis of both of his hips.  His right hip x-ray shows protrusio on x-ray in the left hip also shows bone-on-bone wear with no protrusio.  Both his hips are incredibly stiff and his pain is daily.  They are detrimentally vetting his mobility, his quality of life and his actives daily living to the point he does wish to proceed with a hip replacement for the right hip and we agree with this as well based on his clinical exam and x-ray findings.  We did discuss the risks of acute blood loss anemia, nerve vessel injury, fracture, infection, DVT, dislocation, implant failure, leg length differences and wound healing issues.  He knows that we will likely have him longer on the side due to his severe arthritis on the left side that required hip replacement and that we can have a better chance of equaling his leg lengths then.  Procedure description: After informed consent was obtained and the appropriate right hip was marked, the patient was  brought to the operating room and set up on the stretcher where spinal anesthesia was obtained.  He was then laid in supine position on stretcher and a Foley catheter is placed.  Traction boots were placed on both his feet and next he was placed supine on the Hana fracture table with a perineal post and placed in both legs in inline skeletal traction devices but no traction applied.  His right operative hip and pelvis were assessed radiographically.  The right hip was prepped and draped with DuraPrep and sterile drapes.  A timeout was called and he was identified the correct patient the correct right hip.  An incision was then made just inferior and posterior to the ASIS and carried slightly obliquely down the leg.  Dissection was carried down to the tensor fascia lata muscle and the tensor fascia was then divided longitudinally to proceed with a direct interposed the hip.  Circumflex vessels were identified and cauterized.  The hip capsule was identified and opened up in L-type format.  Cobra tractors were placed around the medial and lateral femoral neck and a femoral neck cut was made with an oscillating saw just proximal to the lesser trochanter and this cut was completed with an osteotome.  A corkscrew was placed in the femoral head and the femoral head was removed in its entirety and there was a wide area devoid of cartilage.  A bent Hohmann was then placed over the medial acetabular rim and remnants of the acetabular labrum and other debris removed.  Reaming was initiated from a size 43 reamer and stepwise increments going up to a  size 57 reamer with all reamers placed under direct visualization and the last reamer also placed under direct fluoroscopy in order to obtain the depth of reaming, the inclination and the anteversion.  We then placed the real DePuy Sectra GRIPTION acetabular component size 58 without difficulty followed by a 36+4 neutral polythene liner.  Again this was placed under direct  fluoroscopy and visualization.  Attention was then turned to the femur.  With the right leg externally rotated to 120 degrees, extended and adducted, a Mueller retractor was placed medially and a Hohmann retractor around the greater trochanter.  The lateral joint capsule was released and a box cutting osteotome was used to enter the femoral canal.  Broaching was then initiated using the Actis broaching system from a size 0 going to a size 5.  With a size 5 in place we trialed a high offset femoral neck based on his anatomy and went with a 36+1.5 trial head ball.  The right leg was brought over and up and with traction and internal rotation reduced in the pelvis.  Based on radiographic and mechanical assessment we felt like we needed just a little bit more leg length.  We dislocated the hip and removed the trial components.  We then placed the real Actis femoral component with high offset the size 5 and the real 36+5 ceramic head ball and again reduces the acetabular pleased with ligament, offset, range of motion and stability knowing again that we are making him a little bit long knowing he has severe arthritis on his left side that will eventually require hip replacement left side.  The soft tissue was then irrigated with normal saline solution.  Remnants of the joint capsule were closed with interrupted #1 Ethibond suture followed by normal Vicryl close tensor fascia.  0 Vicryl was used to close the deep tissue and 2-0 Vicryl was used to close subcutaneous tissue.  The skin was closed with staples.  An Aquacel dressing was applied.  The patient was taken off the Hana table and taken the recovery room in stable condition.  Tory Gaskins, PA-C did assist during the entire case and beginning the end and his assistance was crucial and medically necessary for soft tissue management and retraction, helping guide implant placement and a layered closure of the wound.

## 2024-04-05 NOTE — Anesthesia Preprocedure Evaluation (Addendum)
 Anesthesia Evaluation  Patient identified by MRN, date of birth, ID band Patient awake    Reviewed: Allergy & Precautions, NPO status , Patient's Chart, lab work & pertinent test results  History of Anesthesia Complications Negative for: history of anesthetic complications  Airway Mallampati: II  TM Distance: >3 FB Neck ROM: Full    Dental  (+) Dental Advisory Given, Missing   Pulmonary neg pulmonary ROS   Pulmonary exam normal breath sounds clear to auscultation       Cardiovascular Exercise Tolerance: Good negative cardio ROS Normal cardiovascular exam Rhythm:Regular Rate:Normal     Neuro/Psych negative neurological ROS  negative psych ROS   GI/Hepatic negative GI ROS, Neg liver ROS,,,  Endo/Other  negative endocrine ROS    Renal/GU negative Renal ROS     Musculoskeletal  (+) Arthritis ,    Abdominal   Peds  Hematology  (+) Blood dyscrasia, anemia Plt 232k   Anesthesia Other Findings Day of surgery medications reviewed with the patient.  Reproductive/Obstetrics                              Anesthesia Physical Anesthesia Plan  ASA: 2  Anesthesia Plan: Spinal   Post-op Pain Management: Tylenol  PO (pre-op)* and Toradol  IV (intra-op)*   Induction:   PONV Risk Score and Plan: 2 and Scopolamine patch - Pre-op, Dexamethasone  and Ondansetron   Airway Management Planned: Natural Airway  Additional Equipment:   Intra-op Plan:   Post-operative Plan:   Informed Consent: I have reviewed the patients History and Physical, chart, labs and discussed the procedure including the risks, benefits and alternatives for the proposed anesthesia with the patient or authorized representative who has indicated his/her understanding and acceptance.     Dental advisory given  Plan Discussed with: CRNA, Anesthesiologist and Surgeon  Anesthesia Plan Comments: (Discussed risks and benefits of and  differences between spinal and general. Discussed risks of spinal including headache, backache, failure, bleeding, infection, and nerve damage. Patient consents to spinal. Questions answered. Coagulation studies and platelet count acceptable.)         Anesthesia Quick Evaluation

## 2024-04-05 NOTE — Interval H&P Note (Signed)
 History and Physical Interval Note: The patient understands that he is here today for a right total hip replacement to treat his significant right hip pain and arthritis.  There has been no acute or interval change in his medical status.  The risks and benefits of surgery have been discussed in detail and informed consent has been obtained.  The right operative hip has been marked.  04/05/2024 6:48 AM  Richard Peters  has presented today for surgery, with the diagnosis of osteoarthritis right hip.  The various methods of treatment have been discussed with the patient and family. After consideration of risks, benefits and other options for treatment, the patient has consented to  Procedure(s): ARTHROPLASTY, HIP, TOTAL, ANTERIOR APPROACH (Right) as a surgical intervention.  The patient's history has been reviewed, patient examined, no change in status, stable for surgery.  I have reviewed the patient's chart and labs.  Questions were answered to the patient's satisfaction.     [name]

## 2024-04-05 NOTE — Evaluation (Signed)
 Physical Therapy Evaluation Patient Details Name: Richard Peters MRN: [id number] DOB: Jul 21, 1961 Today's Date: 04/05/2024  History of Present Illness  Pt s/p R THR and with hx of bil hip OA - plans 2nd THR in near future  Clinical Impression  Pt s/p R THR and presents with decreased R LE strength/ROM and post op pain limiting functional mobility.  Pt should progress well to dc home with family assist.        If plan is discharge home, recommend the following: A little help with walking and/or transfers;A little help with bathing/dressing/bathroom;Assistance with cooking/housework;Assist for transportation;Help with stairs or ramp for entrance   Can travel by private vehicle        Equipment Recommendations Rolling walker (2 wheels)  Recommendations for Other Services       Functional Status Assessment       Precautions / Restrictions Precautions Precautions: Fall Recall of Precautions/Restrictions: Intact Restrictions Weight Bearing Restrictions Per Provider Order: No Other Position/Activity Restrictions: WBAT      Mobility  Bed Mobility Overal bed mobility: Needs Assistance Bed Mobility: Supine to Sit     Supine to sit: Min assist     General bed mobility comments: Increased time with cues for sequence and use of L LE to self assist    Transfers Overall transfer level: Needs assistance Equipment used: Rolling walker (2 wheels) Transfers: Sit to/from Stand Sit to Stand: Min assist           General transfer comment: cues for LE management and use of UEs to self assist    Ambulation/Gait Ambulation/Gait assistance: Min assist Gait Distance (Feet): 45 Feet Assistive device: Rolling walker (2 wheels) Gait Pattern/deviations: Step-to pattern Gait velocity: decr     General Gait Details: cues for posture, position from RW and initial sequence  Stairs            Wheelchair Mobility     Tilt Bed    Modified Rankin (Stroke Patients Only)        Balance Overall balance assessment: Needs assistance Sitting-balance support: No upper extremity supported Sitting balance-Leahy Scale: Fair     Standing balance support: Bilateral upper extremity supported Standing balance-Leahy Scale: Poor                               Pertinent Vitals/Pain Pain Assessment Pain Assessment: 0-10 Pain Score: 7  Pain Location: R hip/thigh Pain Descriptors / Indicators: Aching, Sore Pain Intervention(s): Limited activity within patient's tolerance, Monitored during session, Premedicated before session (Pt declines ice packs)    Home Living Family/patient expects to be discharged to:: Private residence Living Arrangements: Spouse/significant other Available Help at Discharge: Family;Available 24 hours/day Type of Home: House Home Access: Stairs to enter Entrance Stairs-Rails: Right;Left;Can reach both Entrance Stairs-Number of Steps: 5   Home Layout: One level Home Equipment: Rollator (4 wheels);Cane - single point      Prior Function Prior Level of Function : Independent/Modified Independent             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, operational Extremity Assessment Upper Extremity Assessment: Overall WFL for tasks assessed    Lower Extremity Assessment Lower Extremity Assessment: RLE deficits/detail;LLE deficits/detail RLE: Unable to fully assess due to pain LLE Deficits / Details: significant arthritic changes limiting ROM    Cervical / Trunk Assessment Cervical / Trunk Assessment: Other exceptions (Stooped  posture 2* chronic back issues)  Communication   Communication Communication: No apparent difficulties    Cognition Arousal: Alert Behavior During Therapy: WFL for tasks assessed/performed   PT - Cognitive impairments: No apparent impairments                         Following commands: Intact       Cueing Cueing Techniques:  Verbal cues     General Comments      Exercises Total Joint Exercises Ankle Circles/Pumps: AROM, Both, 15 reps, Supine   Assessment/Plan    PT Assessment    PT Problem List         PT Treatment Interventions      PT Goals (Current goals can be found in the Care Plan section)  Acute Rehab PT Goals Patient Stated Goal: Regain IND PT Goal Formulation: With patient Time For Goal Achievement: 04/11/24 Potential to Achieve Goals: Good    Frequency 7X/week     Co-evaluation               AM-PAC PT 6 Clicks Mobility  Outcome Measure Help needed turning from your back to your side while in a flat bed without using bedrails?: A Little Help needed moving from lying on your back to sitting on the side of a flat bed without using bedrails?: A Little Help needed moving to and from a bed to a chair (including a wheelchair)?: A Little Help needed standing up from a chair using your arms (e.g., wheelchair or bedside chair)?: A Little Help needed to walk in hospital room?: A Little Help needed climbing 3-5 steps with a railing? : A Lot 6 Click Score: 17    End of Session Equipment Utilized During Treatment: Gait belt Activity Tolerance: Patient tolerated treatment well Patient left: in chair;with call bell/phone within reach;with family/visitor present Nurse Communication: Mobility status PT Visit Diagnosis: Difficulty in walking, not elsewhere classified (R26.2)    Time: 8361-8291 PT Time Calculation (min) (ACUTE ONLY): 30 min   Charges:   PT Evaluation $PT Eval Low Complexity: 1 Low PT Treatments $Gait Training: 8-22 mins PT General Charges $$ ACUTE PT VISIT: 1 Visit         [facility] PT Acute Rehabilitation Services Office [phone]   [facility] 04/05/2024, 5:23 PM

## 2024-04-05 NOTE — Anesthesia Procedure Notes (Signed)
 Procedure Name: MAC Date/Time: 04/05/2024 9:03 AM  Performed by: Judythe Tanda Aran, CRNAPre-anesthesia Checklist: Patient identified, Emergency Drugs available, Suction available and Patient being monitored Patient Re-evaluated:Patient Re-evaluated prior to induction Oxygen Delivery Method: Simple face mask

## 2024-04-05 NOTE — Anesthesia Postprocedure Evaluation (Signed)
 Anesthesia Post Note  Patient: Richard Peters  Procedure(s) Performed: ARTHROPLASTY, HIP, TOTAL, ANTERIOR APPROACH (Right: Hip)     Patient location during evaluation: PACU Anesthesia Type: Spinal Level of consciousness: awake, awake and alert and oriented Pain management: pain level controlled Vital Signs Assessment: post-procedure vital signs reviewed and stable Respiratory status: spontaneous breathing, nonlabored ventilation and respiratory function stable Cardiovascular status: blood pressure returned to baseline and stable Postop Assessment: no headache, no backache, spinal receding and no apparent nausea or vomiting Anesthetic complications: no   No notable events documented.  Last Vitals:  Vitals:   04/05/24 1130 04/05/24 1148  BP: 133/75 128/81  Pulse: (!) 50 (!) 54  Resp: 14 14  Temp: (!) 36.4 C 36.5 C  SpO2: 100% 98%    Last Pain:  Vitals:   04/05/24 1148  TempSrc: Oral  PainSc:                  [name]

## 2024-04-05 NOTE — TOC Initial Note (Signed)
 Transition of Care [facility]) - Initial/Assessment Note    Patient Details  Name: Richard Peters MRN: [id number] Date of Birth: 1961-01-28  Transition of [facility]) CM/SW Contact:    Sonda Manuella Quill, RN Phone Number: 04/05/2024, 2:51 PM  Clinical Narrative:                 Spoke w/ pt, wife Nena, and mother in room; pt says he lives at home and he plans to return at d/c; pt identified POC wife Demoni Gergen [phone]; she will provide transportation; pt verified insurance/PCP; he denied SDOH risks; pt says he has already has BSC, and walker; he declined having DME ordered; his HHPT has been arranged w/ [facility] by MD's office; pt says he does not have home oxygen; awaiting PT eval; TOC is following.  Expected Discharge Plan: Home w Home Health Services Barriers to Discharge: Continued Medical Work up   Patient Goals and CMS Choice Patient states their goals for this hospitalization and ongoing recovery are:: home          Expected Discharge Plan and Services   Discharge Planning Services: CM Consult   Living arrangements for the past 2 months: Single Family Home                                      Prior Living Arrangements/Services Living arrangements for the past 2 months: Single Family Home Lives with:: Spouse Patient language and need for interpreter reviewed:: Yes Do you feel safe going back to the place where you live?: Yes      Need for Family Participation in Patient Care: Yes (Comment) Care giver support system in place?: Yes (comment) Current home services: DME (walker, BSC) Criminal Activity/Legal Involvement Pertinent to Current Situation/Hospitalization: No - Comment as needed  Activities of Daily Living   ADL Screening (condition at time of admission) Independently performs ADLs?: Yes (appropriate for developmental age) Is the patient deaf or have difficulty hearing?: No Does the patient have difficulty seeing, even when wearing  glasses/contacts?: No Does the patient have difficulty concentrating, remembering, or making decisions?: No  Permission Sought/Granted Permission sought to share information with : Case Manager Permission granted to share information with : Yes, Verbal Permission Granted  Share Information with NAME: Case Manager     Permission granted to share info w Relationship: Manjot Hinks (spouse) [phone]     Emotional Assessment Appearance:: Appears stated age Attitude/Demeanor/Rapport: Gracious Affect (typically observed): Accepting Orientation: : Oriented to Self, Oriented to Place, Oriented to  Time, Oriented to Situation Alcohol / Substance Use: Not Applicable Psych Involvement: No (comment)  Admission diagnosis:  Primary osteoarthritis of right hip [M16.11] Status post total replacement of right hip [Z96.641] Patient Active Problem List   Diagnosis Date Noted   Status post total replacement of right hip 04/05/2024   Unilateral primary osteoarthritis, right hip 04/04/2024   S/P laparoscopic cholecystectomy 05/02/2022   History of hydrocelectomy 05/02/2022   PCP:  Joyce Norleen BROCKS, MD Pharmacy:   [facility] [phone] [address] [place], [place] - [address] AT [address] [address] [place] [address] Phone: [phone] Fax: [phone]     Social Drivers of Health (SDOH) Social History: SDOH Screenings   Food Insecurity: No Food Insecurity (04/05/2024)  Housing: Low Risk  (04/05/2024)  Transportation Needs: No Transportation Needs (04/05/2024)  Utilities: Not At Risk (04/05/2024)  Depression (PHQ2-9): Low Risk  (05/31/2023)  Financial Resource Strain: Low Risk  (05/31/2023)  Physical Activity: Inactive (05/31/2023)  Social Connections: Moderately Isolated (05/31/2023)  Stress: No Stress Concern Present (05/31/2023)  Tobacco Use: Low Risk  (04/05/2024)   SDOH Interventions: Food Insecurity Interventions: Intervention Not Indicated, Inpatient  TOC Housing Interventions: Intervention Not Indicated, Inpatient TOC Transportation Interventions: Intervention Not Indicated, Inpatient TOC Utilities Interventions: Intervention Not Indicated, Inpatient TOC   Readmission Risk Interventions     No data to display

## 2024-04-05 NOTE — Transfer of Care (Signed)
 Immediate Anesthesia Transfer of Care Note  Patient: Oberon Hehir Dooly  Procedure(s) Performed: ARTHROPLASTY, HIP, TOTAL, ANTERIOR APPROACH (Right: Hip)  Patient Location: PACU  Anesthesia Type:Spinal  Level of Consciousness: drowsy  Airway & Oxygen Therapy: Patient Spontanous Breathing and Patient connected to nasal cannula oxygen  Post-op Assessment: Report given to RN and Post -op Vital signs reviewed and stable  Post vital signs: Reviewed and stable  Last Vitals:  Vitals Value Taken Time  BP 101/61 04/05/24 10:25  Temp    Pulse 55 04/05/24 10:27  Resp 14 04/05/24 10:27  SpO2 100 % 04/05/24 10:27  Vitals shown include unfiled device data.  Last Pain:  Vitals:   04/05/24 0725  TempSrc: Oral  PainSc:          Complications: No notable events documented.

## 2024-04-06 DIAGNOSIS — M1611 Unilateral primary osteoarthritis, right hip: Secondary | ICD-10-CM | POA: Diagnosis not present

## 2024-04-06 LAB — BASIC METABOLIC PANEL WITH GFR
Anion gap: 8 (ref 5–15)
BUN: 14 mg/dL (ref 8–23)
CO2: 25 mmol/L (ref 22–32)
Calcium: 8.6 mg/dL — ABNORMAL LOW (ref 8.9–10.3)
Chloride: 109 mmol/L (ref 98–111)
Creatinine, Ser: 1.06 mg/dL (ref 0.61–1.24)
GFR, Estimated: >60 mL/min (ref >60)
Glucose, Bld: 112 mg/dL — ABNORMAL HIGH (ref 70–99)
Potassium: 3.6 mmol/L (ref 3.5–5.1)
Sodium: 142 mmol/L (ref 135–145)

## 2024-04-06 LAB — CBC
HCT: 34.4 % — ABNORMAL LOW (ref 39.0–52.0)
Hemoglobin: 11.1 g/dL — ABNORMAL LOW (ref 13.0–17.0)
MCH: 27.9 pg (ref 26.0–34.0)
MCHC: 32.3 g/dL (ref 30.0–36.0)
MCV: 86.4 fL (ref 80.0–100.0)
Platelets: 189 K/uL (ref 150–400)
RBC: 3.98 MIL/uL — ABNORMAL LOW (ref 4.22–5.81)
RDW: 14 % (ref 11.5–15.5)
WBC: 8.5 K/uL (ref 4.0–10.5)
nRBC: 0 % (ref 0.0–0.2)

## 2024-04-06 MED ORDER — OXYCODONE HCL 5 MG PO TABS: 5.0000 mg | ORAL_TABLET | Freq: Four times a day (QID) | ORAL | 0 refills | Status: DC | PRN

## 2024-04-06 MED ORDER — METHOCARBAMOL 500 MG PO TABS: 500.0000 mg | ORAL_TABLET | Freq: Four times a day (QID) | ORAL | 1 refills | Status: DC | PRN

## 2024-04-06 MED ORDER — ASPIRIN 81 MG PO CHEW: 81.0000 mg | CHEWABLE_TABLET | Freq: Two times a day (BID) | ORAL | 0 refills | Status: DC

## 2024-04-06 NOTE — Discharge Instructions (Signed)

## 2024-04-06 NOTE — Progress Notes (Signed)
 Physical Therapy Treatment Patient Details Name: ELDWIN Peters MRN: [id number] DOB: March 28, 1961 Today's Date: 04/06/2024   History of Present Illness Pt s/p R THR and with hx of bil hip OA - plans 2nd THR in near future    PT Comments  Pt very cooperative and progressing well with mobility but with 7/10 pain and requiring increased time.  Therex program initiated, pt up to ambulate increased distance in hall, negotiated stairs, and reviewed car transfers.  Spouse present for session.   If plan is discharge home, recommend the following: A little help with walking and/or transfers;A little help with bathing/dressing/bathroom;Assistance with cooking/housework;Assist for transportation;Help with stairs or ramp for entrance   Can travel by private vehicle        Equipment Recommendations  Rolling walker (2 wheels)    Recommendations for Other Services       Precautions / Restrictions Precautions Precautions: Fall Recall of Precautions/Restrictions: Intact Restrictions Weight Bearing Restrictions Per Provider Order: No Other Position/Activity Restrictions: WBAT     Mobility  Bed Mobility Overal bed mobility: Needs Assistance Bed Mobility: Supine to Sit     Supine to sit: Contact guard, Used rails     General bed mobility comments: Increased time with cues for sequence and use of L LE to self assist    Transfers Overall transfer level: Needs assistance Equipment used: Rolling walker (2 wheels) Transfers: Sit to/from Stand Sit to Stand: Contact guard assist           General transfer comment: cues for LE management and use of UEs to self assist    Ambulation/Gait Ambulation/Gait assistance: Contact guard assist, Supervision Gait Distance (Feet): 100 Feet Assistive device: Rolling walker (2 wheels) Gait Pattern/deviations: Step-to pattern Gait velocity: decr     General Gait Details: cues for posture, position from RW and initial sequence   Stairs Stairs:  Yes Stairs assistance: Contact guard assist Stair Management: Two rails, Step to pattern, Forwards Number of Stairs: 3 General stair comments: cues for sequence   Wheelchair Mobility     Tilt Bed    Modified Rankin (Stroke Patients Only)       Balance Overall balance assessment: Needs assistance Sitting-balance support: No upper extremity supported Sitting balance-Leahy Scale: Good     Standing balance support: No upper extremity supported Standing balance-Leahy Scale: Fair                              Hotel manager: No apparent difficulties  Cognition Arousal: Alert Behavior During Therapy: WFL for tasks assessed/performed   PT - Cognitive impairments: No apparent impairments                         Following commands: Intact      Cueing Cueing Techniques: Verbal cues  Exercises Total Joint Exercises Ankle Circles/Pumps: AROM, Both, 15 reps, Supine Heel Slides: AAROM, Right, 15 reps, Supine Hip ABduction/ADduction: AAROM, Right, 15 reps, Supine    General Comments        Pertinent Vitals/Pain Pain Assessment Pain Assessment: 0-10 Pain Score: 7  Pain Location: R hip/thigh with activity Pain Descriptors / Indicators: Aching, Sore Pain Intervention(s): Limited activity within patient's tolerance, Monitored during session, Premedicated before session, Ice applied    Home Living                          Prior Function  PT Goals (current goals can now be found in the care plan section) Acute Rehab PT Goals Patient Stated Goal: Regain IND PT Goal Formulation: With patient Time For Goal Achievement: 04/11/24 Potential to Achieve Goals: Good Progress towards PT goals: Progressing toward goals    Frequency    7X/week      PT Plan      Co-evaluation              AM-PAC PT 6 Clicks Mobility   Outcome Measure  Help needed turning from your back to your side while  in a flat bed without using bedrails?: A Little Help needed moving from lying on your back to sitting on the side of a flat bed without using bedrails?: A Little Help needed moving to and from a bed to a chair (including a wheelchair)?: A Little Help needed standing up from a chair using your arms (e.g., wheelchair or bedside chair)?: A Little Help needed to walk in hospital room?: A Little Help needed climbing 3-5 steps with a railing? : A Little 6 Click Score: 18    End of Session Equipment Utilized During Treatment: Gait belt Activity Tolerance: Patient tolerated treatment well Patient left: in chair;with call bell/phone within reach;with family/visitor present Nurse Communication: Mobility status PT Visit Diagnosis: Difficulty in walking, not elsewhere classified (R26.2)     Time: 8976-8897 PT Time Calculation (min) (ACUTE ONLY): 39 min  Charges:    $Gait Training: 8-22 mins $Therapeutic Exercise: 8-22 mins $Therapeutic Activity: 8-22 mins PT General Charges $$ ACUTE PT VISIT: 1 Visit                     [facility] PT Acute Rehabilitation Services Office [phone]    Lenette Rau 04/06/2024, 12:00 PM

## 2024-04-06 NOTE — Progress Notes (Signed)
 Subjective: 1 Day Post-Op Procedure(s) (LRB): ARTHROPLASTY, HIP, TOTAL, ANTERIOR APPROACH (Right) Patient reports pain as moderate.    Objective: Vital signs in last 24 hours: Temp:  [97.8 F (36.6 C)-98.9 F (37.2 C)] 98.9 F (37.2 C) (07/26 1010) Pulse Rate:  [58-68] 65 (07/26 1010) Resp:  [16-18] 17 (07/26 1010) BP: (100-132)/(61-88) 100/67 (07/26 1010) SpO2:  [96 %-100 %] 96 % (07/26 1010)  Intake/Output from previous day: 07/25 0701 - 07/26 0700 In: 2500.7 [P.O.:120; I.V.:1980.7; IV Piggyback:400] Out: 1320 [Urine:1300; Blood:20] Intake/Output this shift: Total I/O In: 341.4 [P.O.:240; I.V.:101.4] Out: -   Recent Labs    04/06/24 0328  HGB 11.1*   Recent Labs    04/06/24 0328  WBC 8.5  RBC 3.98*  HCT 34.4*  PLT 189   Recent Labs    04/06/24 0328  NA 142  K 3.6  CL 109  CO2 25  BUN 14  CREATININE 1.06  GLUCOSE 112*  CALCIUM 8.6*   No results for input(s): LABPT, INR in the last 72 hours.  Sensation intact distally Intact pulses distally Dorsiflexion/Plantar flexion intact Incision: dressing C/D/I   Assessment/Plan: 1 Day Post-Op Procedure(s) (LRB): ARTHROPLASTY, HIP, TOTAL, ANTERIOR APPROACH (Right) Up with therapy Discharge home with home health      [name] 04/06/2024, 12:23 PM

## 2024-04-06 NOTE — Discharge Summary (Signed)
 Patient ID: Richard Peters MRN: [id number] DOB/AGE: 1961/01/28 63 y.o.  Admit date: 04/05/2024 Discharge date: 04/06/2024  Admission Diagnoses:  Principal Problem:   Unilateral primary osteoarthritis, right hip Active Problems:   Status post total replacement of right hip   Discharge Diagnoses:  Same  Past Medical History:  Diagnosis Date   Arthritis     Surgeries: Procedure(s): ARTHROPLASTY, HIP, TOTAL, ANTERIOR APPROACH on 04/05/2024   Consultants:   Discharged Condition: Improved  Hospital Course: Richard Peters is an 63 y.o. male who was admitted 04/05/2024 for operative treatment ofUnilateral primary osteoarthritis, right hip. Patient has severe unremitting pain that affects sleep, daily activities, and work/hobbies. After pre-op clearance the patient was taken to the operating room on 04/05/2024 and underwent  Procedure(s): ARTHROPLASTY, HIP, TOTAL, ANTERIOR APPROACH.    Patient was given perioperative antibiotics:  Anti-infectives (From admission, onward)    Start     Dose/Rate Route Frequency Ordered Stop   04/05/24 1500  ceFAZolin  (ANCEF ) IVPB 2g/100 mL premix        2 g 200 mL/hr over 30 Minutes Intravenous Every 6 hours 04/05/24 1136 04/05/24 2200   04/05/24 0700  ceFAZolin  (ANCEF ) IVPB 2g/100 mL premix        2 g 200 mL/hr over 30 Minutes Intravenous On call to O.R. 04/05/24 [id number] 04/05/24 0902        Patient was given sequential compression devices, early ambulation, and chemoprophylaxis to prevent DVT.  Patient benefited maximally from hospital stay and there were no complications.    Recent vital signs: Patient Vitals for the past 24 hrs:  BP Temp Temp src Pulse Resp SpO2  04/06/24 1010 100/67 98.9 F (37.2 C) Oral 65 17 96 %  04/06/24 0613 106/61 98.3 F (36.8 C) Oral (!) 58 17 99 %  04/06/24 0156 129/88 97.8 F (36.6 C) Oral (!) 58 16 100 %  04/05/24 2112 121/79 98.1 F (36.7 C) Oral 68 18 97 %  04/05/24 1442 132/86 97.9 F (36.6 C) -- 64 18  99 %     Recent laboratory studies:  Recent Labs    04/06/24 0328  WBC 8.5  HGB 11.1*  HCT 34.4*  PLT 189  NA 142  K 3.6  CL 109  CO2 25  BUN 14  CREATININE 1.06  GLUCOSE 112*  CALCIUM 8.6*     Discharge Medications:   Allergies as of 04/06/2024   No Known Allergies      Medication List     TAKE these medications    aspirin  81 MG chewable tablet Chew 1 tablet (81 mg total) by mouth 2 (two) times daily.   methocarbamol  500 MG tablet Commonly known as: ROBAXIN  Take 1 tablet (500 mg total) by mouth every 6 (six) hours as needed for muscle spasms.   multivitamin with minerals Tabs tablet Take 1 tablet by mouth daily. One a day 50+   oxyCODONE  5 MG immediate release tablet Commonly known as: Oxy IR/ROXICODONE  Take 1-2 tablets (5-10 mg total) by mouth every 6 (six) hours as needed for moderate pain (pain score 4-6) (pain score 4-6).               Durable Medical Equipment  (From admission, onward)           Start     Ordered   04/05/24 1137  DME 3 n 1  Once        04/05/24 1136   04/05/24 1137  DME Walker rolling  Once  Question Answer Comment  Walker: With 5 Inch Wheels   Patient needs a walker to treat with the following condition Status post total replacement of right hip      04/05/24 1136            Diagnostic Studies: DG HIP UNILAT WITH PELVIS 1V RIGHT Result Date: 04/05/2024 CLINICAL DATA:  Postop total hip replacement EXAM: DG HIP (WITH OR WITHOUT PELVIS) 1V RIGHT; PORTABLE PELVIS 1-2 VIEWS COMPARISON:  02/01/2024 FINDINGS: Intraoperative imaging at 8:58 a.m. demonstrates right hip arthroplasty. A total of 3 images. No hardware complication identified. Postoperative AP view of the pelvis demonstrates right hip arthroplasty. Staples overlie the lateral right hip. No hardware complication or periprosthetic fracture. Severe central left hip joint space narrowing and osteophyte formation with acetabular protrusion. Sacroiliac joints are  symmetric. IMPRESSION: Expected appearance after right hip arthroplasty. Advanced left hip joint space narrowing. Electronically Signed   By: Rockey Kilts M.D.   On: 04/05/2024 11:56   DG Pelvis Portable Result Date: 04/05/2024 CLINICAL DATA:  Postop total hip replacement EXAM: DG HIP (WITH OR WITHOUT PELVIS) 1V RIGHT; PORTABLE PELVIS 1-2 VIEWS COMPARISON:  02/01/2024 FINDINGS: Intraoperative imaging at 8:58 a.m. demonstrates right hip arthroplasty. A total of 3 images. No hardware complication identified. Postoperative AP view of the pelvis demonstrates right hip arthroplasty. Staples overlie the lateral right hip. No hardware complication or periprosthetic fracture. Severe central left hip joint space narrowing and osteophyte formation with acetabular protrusion. Sacroiliac joints are symmetric. IMPRESSION: Expected appearance after right hip arthroplasty. Advanced left hip joint space narrowing. Electronically Signed   By: Rockey Kilts M.D.   On: 04/05/2024 11:56   DG C-Arm 1-60 Min-No Report Result Date: 04/05/2024 Fluoroscopy was utilized by the requesting physician.  No radiographic interpretation.    Disposition: Discharge disposition: 01-Home or Self Care          Follow-up Information     [facility] [place]  Follow up.   Contact information: [address] [place] [address] [phone]         Vernetta Lonni GRADE, MD Follow up in 2 week(s).   Specialty: Orthopedic Surgery Contact information: [address] [place]  [place] [address] [phone]                  Signed: Lonni GRADE Vernetta 04/06/2024, 12:24 PM

## 2024-04-07 DIAGNOSIS — Z96641 Presence of right artificial hip joint: Secondary | ICD-10-CM | POA: Diagnosis not present

## 2024-04-07 DIAGNOSIS — Z471 Aftercare following joint replacement surgery: Secondary | ICD-10-CM | POA: Diagnosis not present

## 2024-04-07 DIAGNOSIS — M1612 Unilateral primary osteoarthritis, left hip: Secondary | ICD-10-CM | POA: Diagnosis not present

## 2024-04-07 DIAGNOSIS — Z9049 Acquired absence of other specified parts of digestive tract: Secondary | ICD-10-CM | POA: Diagnosis not present

## 2024-04-07 DIAGNOSIS — Z7982 Long term (current) use of aspirin: Secondary | ICD-10-CM | POA: Diagnosis not present

## 2024-04-08 ENCOUNTER — Encounter (HOSPITAL_COMMUNITY): Payer: Self-pay | Admitting: Orthopaedic Surgery

## 2024-04-09 DIAGNOSIS — M1612 Unilateral primary osteoarthritis, left hip: Secondary | ICD-10-CM | POA: Diagnosis not present

## 2024-04-09 DIAGNOSIS — Z96641 Presence of right artificial hip joint: Secondary | ICD-10-CM | POA: Diagnosis not present

## 2024-04-09 DIAGNOSIS — Z471 Aftercare following joint replacement surgery: Secondary | ICD-10-CM | POA: Diagnosis not present

## 2024-04-09 DIAGNOSIS — Z9049 Acquired absence of other specified parts of digestive tract: Secondary | ICD-10-CM | POA: Diagnosis not present

## 2024-04-09 DIAGNOSIS — Z7982 Long term (current) use of aspirin: Secondary | ICD-10-CM | POA: Diagnosis not present

## 2024-04-11 DIAGNOSIS — M1612 Unilateral primary osteoarthritis, left hip: Secondary | ICD-10-CM | POA: Diagnosis not present

## 2024-04-11 DIAGNOSIS — Z96641 Presence of right artificial hip joint: Secondary | ICD-10-CM | POA: Diagnosis not present

## 2024-04-11 DIAGNOSIS — Z9049 Acquired absence of other specified parts of digestive tract: Secondary | ICD-10-CM | POA: Diagnosis not present

## 2024-04-11 DIAGNOSIS — Z471 Aftercare following joint replacement surgery: Secondary | ICD-10-CM | POA: Diagnosis not present

## 2024-04-11 DIAGNOSIS — Z7982 Long term (current) use of aspirin: Secondary | ICD-10-CM | POA: Diagnosis not present

## 2024-04-12 DIAGNOSIS — Z9049 Acquired absence of other specified parts of digestive tract: Secondary | ICD-10-CM | POA: Diagnosis not present

## 2024-04-12 DIAGNOSIS — Z7982 Long term (current) use of aspirin: Secondary | ICD-10-CM | POA: Diagnosis not present

## 2024-04-12 DIAGNOSIS — Z96641 Presence of right artificial hip joint: Secondary | ICD-10-CM | POA: Diagnosis not present

## 2024-04-12 DIAGNOSIS — M1612 Unilateral primary osteoarthritis, left hip: Secondary | ICD-10-CM | POA: Diagnosis not present

## 2024-04-12 DIAGNOSIS — Z471 Aftercare following joint replacement surgery: Secondary | ICD-10-CM | POA: Diagnosis not present

## 2024-04-15 DIAGNOSIS — Z471 Aftercare following joint replacement surgery: Secondary | ICD-10-CM | POA: Diagnosis not present

## 2024-04-15 DIAGNOSIS — Z7982 Long term (current) use of aspirin: Secondary | ICD-10-CM | POA: Diagnosis not present

## 2024-04-15 DIAGNOSIS — M1612 Unilateral primary osteoarthritis, left hip: Secondary | ICD-10-CM | POA: Diagnosis not present

## 2024-04-15 DIAGNOSIS — Z96641 Presence of right artificial hip joint: Secondary | ICD-10-CM | POA: Diagnosis not present

## 2024-04-15 DIAGNOSIS — Z9049 Acquired absence of other specified parts of digestive tract: Secondary | ICD-10-CM | POA: Diagnosis not present

## 2024-04-17 DIAGNOSIS — Z471 Aftercare following joint replacement surgery: Secondary | ICD-10-CM | POA: Diagnosis not present

## 2024-04-17 DIAGNOSIS — Z9049 Acquired absence of other specified parts of digestive tract: Secondary | ICD-10-CM | POA: Diagnosis not present

## 2024-04-17 DIAGNOSIS — M1612 Unilateral primary osteoarthritis, left hip: Secondary | ICD-10-CM | POA: Diagnosis not present

## 2024-04-17 DIAGNOSIS — Z7982 Long term (current) use of aspirin: Secondary | ICD-10-CM | POA: Diagnosis not present

## 2024-04-17 DIAGNOSIS — Z96641 Presence of right artificial hip joint: Secondary | ICD-10-CM | POA: Diagnosis not present

## 2024-04-18 ENCOUNTER — Ambulatory Visit (INDEPENDENT_AMBULATORY_CARE_PROVIDER_SITE_OTHER): Admitting: Orthopaedic Surgery

## 2024-04-18 DIAGNOSIS — Z96641 Presence of right artificial hip joint: Secondary | ICD-10-CM

## 2024-04-18 MED ORDER — OXYCODONE HCL 5 MG PO TABS: 5.0000 mg | ORAL_TABLET | Freq: Four times a day (QID) | ORAL | 0 refills | Status: DC | PRN

## 2024-04-18 NOTE — Progress Notes (Signed)
 The patient is here today for his first postoperative visit status post a right total hip replacement to treat severe right hip arthritis.  He does have known severe arthritis in his left hip as well.  He has been compliant with taking a baby aspirin  twice daily.  He is dealt with some constipation.  Has been wearing TED hose due to foot and ankle swelling.  On exam his calf is soft.  I would like him to go down to a baby aspirin  once a day for the next week.  His right hip incision looks good.  Staples been removed and Steri-Strips applied.  He will continue to slowly increase his activities as comfort allows.  We will see him back in a month to see how he is doing overall but no x-rays are needed.

## 2024-05-16 ENCOUNTER — Ambulatory Visit (INDEPENDENT_AMBULATORY_CARE_PROVIDER_SITE_OTHER): Admitting: Orthopaedic Surgery

## 2024-05-16 DIAGNOSIS — Z96641 Presence of right artificial hip joint: Secondary | ICD-10-CM

## 2024-05-16 NOTE — Progress Notes (Signed)
 The patient is now at 6 weeks status post a right total hip arthroplasty to treat debilitating right hip arthritis.  He does have known severe arthritis of his left hip.  He says the right hip is doing well and he reports increased range of motion and strength.  He does have some numbness and tingling and some swelling still.  He has been using ice.  We talked about him trying heat at this point.  His right hip is moving better overall.  His left hip is still incredibly stiff and painful with any attempts or rotation.  From our standpoint we will see him back in 5 weeks.  That will be the time for considering a return to work note for the next week after that.  At this visit in 5 weeks we will have just a standing AP pelvis.

## 2024-06-05 ENCOUNTER — Encounter: Payer: Self-pay | Admitting: Family Medicine

## 2024-06-05 ENCOUNTER — Ambulatory Visit: Payer: BC Managed Care – PPO | Admitting: Family Medicine

## 2024-06-05 VITALS — BP 126/68 | HR 70 | Ht 72.0 in | Wt 215.8 lb

## 2024-06-05 DIAGNOSIS — Z0000 Encounter for general adult medical examination without abnormal findings: Secondary | ICD-10-CM | POA: Diagnosis not present

## 2024-06-05 DIAGNOSIS — Z13220 Encounter for screening for lipoid disorders: Secondary | ICD-10-CM | POA: Diagnosis not present

## 2024-06-05 DIAGNOSIS — Z23 Encounter for immunization: Secondary | ICD-10-CM

## 2024-06-05 DIAGNOSIS — Z96641 Presence of right artificial hip joint: Secondary | ICD-10-CM

## 2024-06-05 DIAGNOSIS — Z9049 Acquired absence of other specified parts of digestive tract: Secondary | ICD-10-CM | POA: Diagnosis not present

## 2024-06-05 DIAGNOSIS — Z125 Encounter for screening for malignant neoplasm of prostate: Secondary | ICD-10-CM

## 2024-06-05 LAB — LIPID PANEL

## 2024-06-05 NOTE — Progress Notes (Signed)
 Complete physical exam  Patient: Richard Peters   DOB: 1961-05-05   63 y.o. Male  MRN: [id number]  Subjective:    Chief Complaint  Patient presents with   Annual Exam    Richard Peters is a 63 y.o. male who presents today for a complete physical exam.  He reports consuming a general diet. The patient does not participate in regular exercise at present. He generally feels well. He reports sleeping well. Discussed the use of AI scribe software for clinical note transcription with the patient, who gave verbal consent to proceed.  He underwent right hip surgery on April 05, 2024. Post-surgery, he has been engaging in rehabilitation exercises, including range of motion activities. Initially, he experienced significant difficulty due to pain and limited mobility, but he has gradually improved. Initially, he could not lift his leg high due to pain, but over time, he has been able to increase his range of motion.  His left leg remains problematic, and he has been compensating for it, which has been challenging. He continues to exercise the left leg despite the pain to prepare for the upcoming surgery.  Post-surgery, he experienced numbness and hardness in the area of the incision, described as 'rock hard.' The numbness has decreased over time but is still present. He has been using ice initially to manage swelling and pain, and later, heat to loosen the area.  He recalls being given an epidural during his surgery, which he was not initially aware of, and experienced temporary paralysis from the waist down post-operatively, which resolved after about two hours. He does not smoke or drink.  His marriage is going well.  He is postop and therefore not working but does plan to have the other surgery in February of next year. He is not currently taking any medications except for a multivitamin. He does not smoke or drink alcohol.      Most recent fall risk assessment:    06/05/2024    3:01 PM   Fall Risk   Falls in the past year? 0  Number falls in past yr: 0  Injury with Fall? 0  Risk for fall due to : No Fall Risks  Follow up Falls evaluation completed     Most recent depression screenings:    06/05/2024    3:01 PM 05/31/2023    3:02 PM  PHQ 2/9 Scores  PHQ - 2 Score 0 0    Vision:Not within last year [facility] [place]: Last visit on 4/25. Not sure who dentist is.     Immunization History  Administered Date(s) Administered   Influenza, Seasonal, Injecte, Preservative Fre 05/31/2023, 06/05/2024   Influenza,inj,Quad PF,6+ Mos 05/02/2022   PFIZER(Purple Top)SARS-COV-2 Vaccination 11/25/2019, 12/17/2019, 08/02/2020   PNEUMOCOCCAL CONJUGATE-20 06/05/2024   Tdap 03/12/2002, 04/28/2021   Zoster Recombinant(Shingrix ) 05/02/2022, 05/31/2023    Health Maintenance  Topic Date Due   Colonoscopy  Never done   COVID-19 Vaccine (4 - 2025-26 season) 06/21/2024 (Originally 05/13/2024)   DTaP/Tdap/Td (3 - Td or Tdap) 04/29/2031   Pneumococcal Vaccine: 50+ Years  Completed   Influenza Vaccine  Completed   Hepatitis C Screening  Completed   HIV Screening  Completed   Zoster Vaccines- Shingrix   Completed   Hepatitis B Vaccines 19-59 Average Risk  Aged Out   HPV VACCINES  Aged Out   Meningococcal B Vaccine  Aged Out    Patient Care Team: Joyce Norleen BROCKS, MD as PCP - General (Family Medicine)   Outpatient  Medications Prior to Visit  Medication Sig   Multiple Vitamin (MULTIVITAMIN WITH MINERALS) TABS tablet Take 1 tablet by mouth daily. One a day 50+   [DISCONTINUED] aspirin  81 MG chewable tablet Chew 1 tablet (81 mg total) by mouth 2 (two) times daily.   [DISCONTINUED] methocarbamol  (ROBAXIN ) 500 MG tablet Take 1 tablet (500 mg total) by mouth every 6 (six) hours as needed for muscle spasms.   [DISCONTINUED] oxyCODONE  (OXY IR/ROXICODONE ) 5 MG immediate release tablet Take 1-2 tablets (5-10 mg total) by mouth every 6 (six) hours as needed for moderate pain (pain score 4-6)  (pain score 4-6).   No facility-administered medications prior to visit.    Review of Systems  Neurological:  Weakness: apsec.  All other systems reviewed and are negative.   Family and social history as well as health maintenance and immunizations was reviewed.     Objective:   Alert and in no distress. Tympanic membranes and canals are normal. Pharyngeal area is normal. Neck is supple without adenopathy or thyromegaly. Cardiac exam shows a regular sinus rhythm without murmurs or gallops. Lungs are clear to auscultation. Surgical scar noted on right hip.      Assessment & Plan:    Blood pressure normal. No medications, smoking, or alcohol. Discussed immunizations and cancer screenings. - Administer flu and pneumonia vaccines. - Discuss colon cancer screening options: Cologuard every three years, colonoscopy every ten years. - Discuss prostate cancer screening and shared decision-making. - Add PSA test to blood work.  Status post right total hip arthroplasty Graduated from physical therapy. Numbness and hardness at surgical site normal due to nerve involvement. - Continue home exercises for range of motion and strength. - Apply heat to surgical site for stiffness. - Inspect area visually to prevent unnoticed injuries.  Planned left total hip arthroplasty Surgery scheduled for February 2026. Aware of rehabilitation and timing with disability benefits. - Schedule left total hip arthroplasty for February 2026. - Prepare for rehabilitation post-surgery.      .   Quintavia Rogstad, MD

## 2024-06-06 ENCOUNTER — Ambulatory Visit: Payer: Self-pay | Admitting: Family Medicine

## 2024-06-06 LAB — CBC WITH DIFFERENTIAL/PLATELET
Basophils Absolute: 0 x10E3/uL (ref 0.0–0.2)
Basos: 0 %
EOS (ABSOLUTE): 0.2 x10E3/uL (ref 0.0–0.4)
Eos: 4 %
Hematocrit: 40.1 % (ref 37.5–51.0)
Hemoglobin: 12.7 g/dL — ABNORMAL LOW (ref 13.0–17.7)
Immature Grans (Abs): 0 x10E3/uL (ref 0.0–0.1)
Immature Granulocytes: 0 %
Lymphocytes Absolute: 1.4 x10E3/uL (ref 0.7–3.1)
Lymphs: 30 %
MCH: 27.1 pg (ref 26.6–33.0)
MCHC: 31.7 g/dL (ref 31.5–35.7)
MCV: 86 fL (ref 79–97)
Monocytes Absolute: 0.4 x10E3/uL (ref 0.1–0.9)
Monocytes: 8 %
Neutrophils Absolute: 2.6 x10E3/uL (ref 1.4–7.0)
Neutrophils: 58 %
Platelets: 258 x10E3/uL (ref 150–450)
RBC: 4.69 x10E6/uL (ref 4.14–5.80)
RDW: 15 % (ref 11.6–15.4)
WBC: 4.6 x10E3/uL (ref 3.4–10.8)

## 2024-06-06 LAB — COMPREHENSIVE METABOLIC PANEL WITH GFR
ALT: 9 IU/L (ref 0–44)
AST: 15 IU/L (ref 0–40)
Albumin: 4.3 g/dL (ref 3.9–4.9)
Alkaline Phosphatase: 107 IU/L (ref 47–123)
BUN/Creatinine Ratio: 13 (ref 10–24)
BUN: 13 mg/dL (ref 8–27)
Bilirubin Total: 0.5 mg/dL (ref 0.0–1.2)
CO2: 23 mmol/L (ref 20–29)
Calcium: 10 mg/dL (ref 8.6–10.2)
Chloride: 104 mmol/L (ref 96–106)
Creatinine, Ser: 1.02 mg/dL (ref 0.76–1.27)
Globulin, Total: 2.5 g/dL (ref 1.5–4.5)
Glucose: 80 mg/dL (ref 70–99)
Potassium: 4.1 mmol/L (ref 3.5–5.2)
Sodium: 142 mmol/L (ref 134–144)
Total Protein: 6.8 g/dL (ref 6.0–8.5)
eGFR: 83 mL/min/1.73 (ref >59)

## 2024-06-06 LAB — LIPID PANEL
Cholesterol, Total: 143 mg/dL (ref 100–199)
HDL: 38 mg/dL — AB (ref >39)
LDL CALC COMMENT:: 3.8 ratio (ref 0.0–5.0)
LDL Chol Calc (NIH): 73 mg/dL (ref 0–99)
Triglycerides: 192 mg/dL — AB (ref 0–149)
VLDL Cholesterol Cal: 32 mg/dL (ref 5–40)

## 2024-06-06 LAB — PSA: Prostate Specific Ag, Serum: 0.8 ng/mL (ref 0.0–4.0)

## 2024-06-20 ENCOUNTER — Ambulatory Visit: Admitting: Orthopaedic Surgery

## 2024-06-20 ENCOUNTER — Other Ambulatory Visit (INDEPENDENT_AMBULATORY_CARE_PROVIDER_SITE_OTHER): Payer: Self-pay

## 2024-06-20 ENCOUNTER — Telehealth: Payer: Self-pay | Admitting: Orthopaedic Surgery

## 2024-06-20 ENCOUNTER — Encounter: Payer: Self-pay | Admitting: Orthopaedic Surgery

## 2024-06-20 DIAGNOSIS — Z96641 Presence of right artificial hip joint: Secondary | ICD-10-CM | POA: Diagnosis not present

## 2024-06-20 DIAGNOSIS — M1612 Unilateral primary osteoarthritis, left hip: Secondary | ICD-10-CM

## 2024-06-20 NOTE — Progress Notes (Signed)
 The patient is getting close to 3 months status post a right total hip replacement to treat severe right hip arthritis.  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  He does have known severe anterior arthritis of his left hip and he is considering hip replacement surgery in the left side sometime in maybe February 2026.  He says he is getting better and he has improved range of motion and strength on the right side but some numbness and tingling around the incision which he knows is to be expected.  His right hip does move much more smoothly and fluidly.  The left hip has significant stiffness with internal and external Tatian as well as pain in the groin.  Standing AP pelvis shows a well-seated right total hip arthroplasty and severe end-stage arthritis with bone-on-bone wear of the left hip.  At this point we will see him back in 3 months and then can discuss timing for left hip replacement surgery.  He has no restrictions from my standpoint and we did give him a return to work note starting July 01, 2024.

## 2024-06-20 NOTE — Telephone Encounter (Signed)
 Pt had an appt with Richard Peters. Pt came with return to work forms. Please call when ready for pick up. Pt number is [phone]

## 2024-06-21 NOTE — Telephone Encounter (Signed)
 IC, spoke with patient advised forms are ready to be picked up at front desk.

## 2024-07-15 ENCOUNTER — Encounter: Payer: Self-pay | Admitting: Radiology

## 2024-09-23 ENCOUNTER — Ambulatory Visit: Admitting: Orthopaedic Surgery

## 2024-09-23 DIAGNOSIS — Z96641 Presence of right artificial hip joint: Secondary | ICD-10-CM

## 2024-09-23 DIAGNOSIS — M1612 Unilateral primary osteoarthritis, left hip: Secondary | ICD-10-CM

## 2024-09-23 NOTE — Progress Notes (Signed)
 The patient is getting close to 6 months status post a right total hip replacement to treat significant right hip pain and arthritis.  He is an active 64 year old gentleman.  He has known significant end-stage arthritis in his left hip.  His right hip is doing very well.  His left hip is significantly painful and it is detriment affecting his mobility, his quality of life and his actives daily living.  Again, previous x-rays of the left hip show complete loss of joint space with bone-on-bone arthritis and osteophytes all around the left hip.  We did x-ray both hips just a few months ago.  On exam his gait shows an Trendelenburg gait to the left side.  His right hip exam is entirely normal with fluid range of motion and no discomfort or pain.  His left hip has significant decrease in rotation with stiffness and significant pain.  This is also affecting his posture.  He did let me know that he has lost his job recently.  He is on his wife's insurance and is wanting to figure out what his future is going to be before considering hip replacement on the left side.  He has our surgery scheduler's card.  Even at this point injections will not help nor with therapy help his left hip.  He does need to have this left hip replaced in terms of helping his pain and discomfort and there is not really other modalities that will help.  From our standpoint, the next time we will see him is in 6 months which will be the 1 year standpoint from surgery and we will have a standing AP pelvis at that visit.  If he would like to have surgery scheduled sooner on his left hip, we will proceed with surgery at his convenience.

## 2025-03-17 ENCOUNTER — Ambulatory Visit: Admitting: Orthopaedic Surgery

## 2025-06-05 ENCOUNTER — Encounter: Payer: Self-pay | Admitting: Family Medicine
